# Patient Record
Sex: Male | Born: 1950 | Race: White | Hispanic: No | Marital: Single | Smoking: Never smoker
Health system: Southern US, Community
[De-identification: ages and names within clinical notes are randomized; demographics above are authoritative.]

## PROBLEM LIST (undated history)

## (undated) DIAGNOSIS — E119 Type 2 diabetes mellitus without complications: Secondary | ICD-10-CM

## (undated) DIAGNOSIS — E785 Hyperlipidemia, unspecified: Secondary | ICD-10-CM

## (undated) DIAGNOSIS — I219 Acute myocardial infarction, unspecified: Secondary | ICD-10-CM

## (undated) DIAGNOSIS — I10 Essential (primary) hypertension: Secondary | ICD-10-CM

## (undated) DIAGNOSIS — I639 Cerebral infarction, unspecified: Secondary | ICD-10-CM

## (undated) HISTORY — DX: Essential (primary) hypertension: I10

## (undated) HISTORY — DX: Hyperlipidemia, unspecified: E78.5

## (undated) HISTORY — DX: Acute myocardial infarction, unspecified: I21.9

## (undated) HISTORY — DX: Type 2 diabetes mellitus without complications: E11.9

## (undated) HISTORY — DX: Cerebral infarction, unspecified: I63.9

---

## 2010-01-29 ENCOUNTER — Ambulatory Visit: Payer: Self-pay | Admitting: Vascular Surgery

## 2010-03-10 ENCOUNTER — Ambulatory Visit: Payer: Self-pay | Admitting: Vascular Surgery

## 2010-07-15 ENCOUNTER — Ambulatory Visit: Payer: Self-pay | Admitting: Vascular Surgery

## 2011-03-17 NOTE — Assessment & Plan Note (Signed)
OFFICE VISIT   [name] [name]  DOB:  [birth year]                                       01/29/2010  CHART#:[id number]   CHIEF COMPLAINT:  Bilateral foot pain and numbness.   HISTORY OF PRESENT ILLNESS:  The patient is a 60 year old male referred  by Dr. Suzette Battiest for evaluation for possible peripheral arterial occlusive  disease.  The patient states that he has pain in both of his feet which  primarily is in the evening.  He said it extends from the knee down to  the foot bilaterally.  He has intermittent pain that occurs at night  time when he is sleeping.  He also has numbness, tingling and pins and  needles sensation in both feet as well.  These symptoms have been  present for several months.  The patient has had some relief since  starting gabapentin.  He denies any claudication symptoms.  He denies  any open nonhealing wounds on the feet.  He has had no real significant  improvement in his symptoms over the last few months.   CHRONIC MEDICAL PROBLEMS:  Include diabetes, hypertension, coronary  artery disease and elevated cholesterol.  He has had diabetes since  2004.  All these medical problems are currently fairly well-controlled  and followed mainly by Dr. Alwyn Ren.   PAST SURGICAL HISTORY:  He had a right cataract removed in 2007.   FAMILY HISTORY:  Unremarkable.   SOCIAL HISTORY:  He is single, has two children.  He is currently  unemployed.  He is a nonsmoker, nonconsumer of alcohol.   REVIEW OF SYSTEMS:  Full 12 point review of systems were performed with  the patient today.  Please see intake referral form for details  regarding this.   PHYSICAL EXAM:  Vital signs:  Blood pressure 168/87 in the left arm,  pulse is 73 and regular, temperature is 97.8.  HEENT:  Unremarkable.  Neck:  Has 2+ carotid pulses without bruit.  Chest:  Clear to  auscultation.  Cardiac:  Exam is regular rate and rhythm without murmur.  Abdomen:  Is slightly obese, soft,  nontender, nondistended.  No masses.  Extremities:  He has 2+ femoral, 2+ popliteal pulses bilaterally.  He  has 2+ posterior tibial pulses bilaterally.  He has a 2+ right dorsalis  pedis pulse.  He has a 1+ left dorsalis pedis pulse.  There is some dry  skin on the feet bilaterally.  There is no open laceration.  There is no  significant hair loss.  Neurologic:  Exam shows decreased sensation to  fine touch in the feet bilaterally.  Upper extremity and lower extremity  motor strength is 5/5 and symmetric.  Skin:  Has no open ulcers or  rashes except as mentioned above.   He had bilateral ABIs performed today which were normal bilaterally with  an ABI of 1.07 on the left, 1.11 on the right.  Waveforms were biphasic  bilaterally.   In summary, the patient has a long history of diabetes.  His symptoms  are most consistent with diabetic neuropathy.  Although he may have some  mild peripheral arterial disease he certainly does not require an  intervention at this point as he has palpable pulses in his feet  bilaterally and has bilateral normal ABIs.  I counseled him today that  the best management of his  lower extremity pain symptoms are going to be  primarily treatment of his neuropathy.  This included to continue to  take his gabapentin as well as very strict control of his blood glucose  and overall risk factors.  I also instructed him today in care of his  feet.  He would also benefit from a moisturizing lotion for both legs to  prevent skin cracking and open ulcerations.  He will follow up with me  on an as-needed basis.     Janetta Hora. Fields, MD  Electronically Signed   CEF/MEDQ  D:  01/29/2010  T:  01/30/2010  Job:  [id number]   cc:   Sherlynn Stalls  Dr Celene Kras

## 2011-11-06 ENCOUNTER — Ambulatory Visit (INDEPENDENT_AMBULATORY_CARE_PROVIDER_SITE_OTHER): Payer: Medicare Other | Admitting: Family Medicine

## 2011-11-06 DIAGNOSIS — I10 Essential (primary) hypertension: Secondary | ICD-10-CM

## 2011-11-06 DIAGNOSIS — D509 Iron deficiency anemia, unspecified: Secondary | ICD-10-CM

## 2012-02-03 ENCOUNTER — Other Ambulatory Visit: Payer: Self-pay | Admitting: Family Medicine

## 2012-02-05 ENCOUNTER — Ambulatory Visit: Payer: Medicare Other | Admitting: Family Medicine

## 2012-02-11 ENCOUNTER — Encounter: Payer: Self-pay | Admitting: Family Medicine

## 2012-02-11 ENCOUNTER — Ambulatory Visit (INDEPENDENT_AMBULATORY_CARE_PROVIDER_SITE_OTHER): Payer: Medicare Other | Admitting: Family Medicine

## 2012-02-11 VITALS — BP 170/80 | HR 64 | Temp 97.6°F | Resp 16 | Ht 68.0 in | Wt 238.0 lb

## 2012-02-11 DIAGNOSIS — IMO0001 Reserved for inherently not codable concepts without codable children: Secondary | ICD-10-CM

## 2012-02-11 DIAGNOSIS — E1142 Type 2 diabetes mellitus with diabetic polyneuropathy: Secondary | ICD-10-CM

## 2012-02-11 DIAGNOSIS — I10 Essential (primary) hypertension: Secondary | ICD-10-CM

## 2012-02-11 MED ORDER — INSULIN REGULAR HUMAN 100 UNIT/ML IJ SOLN: INTRAMUSCULAR | Status: DC

## 2012-02-11 MED ORDER — LISINOPRIL-HYDROCHLOROTHIAZIDE 20-12.5 MG PO TABS: 1.0000 | ORAL_TABLET | Freq: Every day | ORAL | Status: DC

## 2012-02-11 MED ORDER — FENOFIBRIC ACID 105 MG PO TABS: 105.0000 mg | ORAL_TABLET | Freq: Every day | ORAL | Status: DC

## 2012-02-11 MED ORDER — TRAMADOL HCL 50 MG PO TABS: ORAL_TABLET | ORAL | Status: DC

## 2012-02-11 MED ORDER — METOPROLOL SUCCINATE ER 25 MG PO TB24: 25.0000 mg | ORAL_TABLET | Freq: Two times a day (BID) | ORAL | Status: DC

## 2012-02-11 MED ORDER — GABAPENTIN 300 MG PO CAPS: 300.0000 mg | ORAL_CAPSULE | Freq: Three times a day (TID) | ORAL | Status: DC

## 2012-02-11 MED ORDER — INSULIN GLARGINE 100 UNIT/ML ~~LOC~~ SOLN: 38.0000 [IU] | Freq: Two times a day (BID) | SUBCUTANEOUS | Status: DC

## 2012-02-15 DIAGNOSIS — I10 Essential (primary) hypertension: Secondary | ICD-10-CM | POA: Insufficient documentation

## 2012-02-15 DIAGNOSIS — IMO0001 Reserved for inherently not codable concepts without codable children: Secondary | ICD-10-CM | POA: Insufficient documentation

## 2012-02-15 NOTE — Progress Notes (Signed)
  Subjective:    Patient ID: [name] [name], male    DOB: [birth year], 61 y.o.   MRN: [id number]  HPI This 61 y.o. Diabetic male is here for medication refills for HTN. Diabetes and neuropathy as well as  Lipid disorder. He is not consistent with FSBS at home; nutrition is improved. (Review of note from last visit in paper chart - Lantus dose= 75 units bid and Humulin R 25 units tid with meals;  but print out of meds lists Lantus at 25 units bid). Pt clarified that he is taking Lantus 75 units once daily at bedtime.    Review of SystemsDenies Hypoglycemic episodes, chest pain, dizziness, diaphoresis, syncope     Objective:   Physical Exam  Vitals reviewed. Constitutional: He is oriented to person, place, and time. He appears well-developed and well-nourished. No distress.  HENT:  Head: Normocephalic and atraumatic.  Eyes: Conjunctivae and EOM are normal. No scleral icterus.  Cardiovascular: Normal rate.   Pulmonary/Chest: Effort normal. No respiratory distress.  Musculoskeletal: He exhibits edema.       Feet: calloused areas on plantar aspect especially involving the great toes and balls of feet  Neurological: He is alert and oriented to person, place, and time. No cranial nerve deficit.  Skin: Skin is dry. There is erythema.       Lower extremity brawny edema with erythema noted  Psychiatric: He has a normal mood and affect. His behavior is normal. Thought content normal.   January 2013: Hb A1c= 9.4%  Today: Hb A1c= 8.3%      Assessment & Plan:   1. Type II or unspecified type diabetes mellitus without mention of complication, uncontrolled  Insulin dose change- Lantus to be given 38 units subq every 12 hours  Pt to do FSBS 2x daily and bring results to next visit. Improve nutrition and work on some weight reduction  2. Obesity, Class II, BMI 35-39.9, with comorbidity    3. HTN (hypertension)  Medications refilled; RTC 3 months

## 2012-02-16 ENCOUNTER — Encounter: Payer: Self-pay | Admitting: Family Medicine

## 2012-02-16 DIAGNOSIS — E1142 Type 2 diabetes mellitus with diabetic polyneuropathy: Secondary | ICD-10-CM | POA: Insufficient documentation

## 2012-02-24 ENCOUNTER — Telehealth: Payer: Self-pay

## 2012-02-24 NOTE — Telephone Encounter (Signed)
Pharmacist called to report that pt called confused about his metoprolol Rx. He used to be on met tartrate one BID, and the new Rx he p/up is for Met succ XL one BID. Spoke w/Dr Audria Nine and she wants pt to take Metoprolol Succ 25 XL, but only one QD and wants pt to RTC in May to see her to check BP since his BP was high at last OV. Called pharm to notify of new sig and they are cancelling RFs since they have the wrong sig on them. Spoke w/pt and explained sig for Rx and f/up. Pt agreed and transferred to appt center to set up appt.

## 2012-03-02 ENCOUNTER — Encounter: Payer: Self-pay | Admitting: Family Medicine

## 2012-03-02 ENCOUNTER — Ambulatory Visit (INDEPENDENT_AMBULATORY_CARE_PROVIDER_SITE_OTHER): Payer: Medicare Other | Admitting: Family Medicine

## 2012-03-02 VITALS — BP 110/69 | HR 69 | Temp 98.3°F | Resp 20 | Ht 67.5 in | Wt 232.8 lb

## 2012-03-02 DIAGNOSIS — I10 Essential (primary) hypertension: Secondary | ICD-10-CM

## 2012-03-02 NOTE — Progress Notes (Signed)
  Subjective:    Patient ID: [name] [name], male    DOB: Oct 08, 1951, 61 y.o.   MRN: [id number]  HPI This 61 y.o.Cauc male returns for BP recheck. He has been compliant with medications and  daily exercise. He denies HA, CP, dizziness, palpitations or edema.    Review of Systems Noncontributory     Objective:   Physical Exam Pt is well- nourished and well-developed; in NAD. Respirations are normal with normal  effort. His heart rate is normal. Neurological exam is grossly normal; pt ambulates with a cane.        Assessment & Plan:  HTN-  Excellent control. No medication changes. RTC in July as scheduled.

## 2012-04-23 ENCOUNTER — Other Ambulatory Visit: Payer: Self-pay

## 2012-04-23 MED ORDER — METOPROLOL SUCCINATE ER 25 MG PO TB24: 25.0000 mg | ORAL_TABLET | Freq: Two times a day (BID) | ORAL | Status: DC

## 2012-05-12 ENCOUNTER — Ambulatory Visit: Payer: Medicare Other | Admitting: Family Medicine

## 2012-05-26 ENCOUNTER — Telehealth: Payer: Self-pay

## 2012-05-26 NOTE — Telephone Encounter (Signed)
I have filled out the form and it is at the TL station - please put NPI on it please.

## 2012-05-26 NOTE — Telephone Encounter (Signed)
Pt needs diabetic supplies call to [facility] - [phone] - please call them and authorize what the patient needs - he should be testing once a day.

## 2012-05-26 NOTE — Telephone Encounter (Signed)
Called customer service [phone]. Can only mail or fax order. Re-faxing paperwork.

## 2012-05-26 NOTE — Telephone Encounter (Signed)
PT STATES [facility] HAS FAXED/CALLED FOR REFILL ON TEST STRIPS AND  WE HAVE NOT RESPONDED,PLEASE F/U  BEST PHONE FOR PT [phone]

## 2012-05-27 NOTE — Telephone Encounter (Signed)
All contact numbers are incorrect. Paperwork has been taken care of and faxed to [facility].

## 2012-06-21 ENCOUNTER — Other Ambulatory Visit: Payer: Self-pay | Admitting: Physician Assistant

## 2012-08-08 ENCOUNTER — Other Ambulatory Visit: Payer: Self-pay | Admitting: *Deleted

## 2012-08-08 MED ORDER — INSULIN GLARGINE 100 UNIT/ML ~~LOC~~ SOLN: 38.0000 [IU] | Freq: Two times a day (BID) | SUBCUTANEOUS | Status: DC

## 2012-08-11 ENCOUNTER — Other Ambulatory Visit: Payer: Self-pay

## 2012-08-11 MED ORDER — TRAMADOL HCL 50 MG PO TABS: ORAL_TABLET | ORAL | Status: DC

## 2012-08-16 ENCOUNTER — Other Ambulatory Visit: Payer: Self-pay | Admitting: Radiology

## 2012-08-23 ENCOUNTER — Other Ambulatory Visit: Payer: Self-pay | Admitting: Physician Assistant

## 2012-08-23 ENCOUNTER — Other Ambulatory Visit: Payer: Self-pay

## 2012-08-23 MED ORDER — LISINOPRIL-HYDROCHLOROTHIAZIDE 20-12.5 MG PO TABS: 1.0000 | ORAL_TABLET | Freq: Every day | ORAL | Status: DC

## 2012-08-23 MED ORDER — INSULIN REGULAR HUMAN 100 UNIT/ML IJ SOLN: INTRAMUSCULAR | Status: DC

## 2012-08-23 MED ORDER — METOPROLOL SUCCINATE ER 25 MG PO TB24: 25.0000 mg | ORAL_TABLET | Freq: Every day | ORAL | Status: DC

## 2012-09-07 ENCOUNTER — Telehealth: Payer: Self-pay

## 2012-09-07 MED ORDER — TRAMADOL HCL 50 MG PO TABS: ORAL_TABLET | ORAL | Status: DC

## 2012-09-07 NOTE — Telephone Encounter (Signed)
Not in RX pool please advise

## 2012-09-07 NOTE — Telephone Encounter (Signed)
Patient advised.

## 2012-09-07 NOTE — Telephone Encounter (Signed)
Refill done and sent in. 

## 2012-09-07 NOTE — Telephone Encounter (Signed)
Pt says that [facility] has faxed [place] several times about his rx for tramadol, [facility] has not heard back from [place] would like to know what is going on ??? Please call patient at [phone]

## 2012-09-16 ENCOUNTER — Other Ambulatory Visit: Payer: Self-pay | Admitting: Physician Assistant

## 2012-09-25 ENCOUNTER — Other Ambulatory Visit: Payer: Self-pay | Admitting: Physician Assistant

## 2012-10-04 ENCOUNTER — Other Ambulatory Visit: Payer: Self-pay | Admitting: Physician Assistant

## 2012-10-06 ENCOUNTER — Telehealth: Payer: Self-pay

## 2012-10-06 ENCOUNTER — Other Ambulatory Visit: Payer: Self-pay | Admitting: Family Medicine

## 2012-10-06 MED ORDER — GLUCOSE BLOOD VI STRP: ORAL_STRIP | Status: DC

## 2012-10-06 NOTE — Telephone Encounter (Signed)
Needs office visit.

## 2012-10-06 NOTE — Telephone Encounter (Signed)
Pt is requesting Diabetic testing supplies (last office visit was April 2013 and A1c= 8.3%). I am familiar with [facility] supplies. I have ordered strips (e-prescribed to pt's pharmacy) along with glucometer.

## 2012-10-06 NOTE — Telephone Encounter (Signed)
Dr. Audria Nine - information of medical supplier is [facility] for diabetic supplies   cbn [phone]

## 2012-10-14 ENCOUNTER — Telehealth: Payer: Self-pay

## 2012-10-14 NOTE — Telephone Encounter (Signed)
I think the call was last week, to advise his supplies ordered for diabetes. He was not in need of this. He states he has this already. He states [name] [name] has left message, to verify his address, it is the same as is listed in the computer. FYI  Brandii Lakey

## 2012-10-14 NOTE — Telephone Encounter (Signed)
The patient stated that he was calling to return a missed call.  No call to patient documented in system.  Please call the patient at [phone].

## 2012-10-14 NOTE — Telephone Encounter (Signed)
Left message to call office to verify address per Dr Audria Nine.  Trying to complete a request for refill of diabetic supplies but address on request does not match address in epic.

## 2012-10-24 ENCOUNTER — Other Ambulatory Visit: Payer: Self-pay | Admitting: Physician Assistant

## 2012-10-24 NOTE — Telephone Encounter (Signed)
Needs OV.  

## 2012-10-27 ENCOUNTER — Other Ambulatory Visit: Payer: Self-pay | Admitting: Physician Assistant

## 2012-11-05 DIAGNOSIS — I10 Essential (primary) hypertension: Secondary | ICD-10-CM

## 2012-11-14 ENCOUNTER — Other Ambulatory Visit: Payer: Self-pay | Admitting: Physician Assistant

## 2012-11-20 ENCOUNTER — Ambulatory Visit (INDEPENDENT_AMBULATORY_CARE_PROVIDER_SITE_OTHER): Payer: Medicare Other | Admitting: Family Medicine

## 2012-11-20 VITALS — BP 192/80 | HR 80 | Temp 98.1°F | Resp 18 | Ht 68.0 in | Wt 228.0 lb

## 2012-11-20 DIAGNOSIS — E1140 Type 2 diabetes mellitus with diabetic neuropathy, unspecified: Secondary | ICD-10-CM

## 2012-11-20 DIAGNOSIS — E1065 Type 1 diabetes mellitus with hyperglycemia: Secondary | ICD-10-CM

## 2012-11-20 DIAGNOSIS — E1149 Type 2 diabetes mellitus with other diabetic neurological complication: Secondary | ICD-10-CM

## 2012-11-20 DIAGNOSIS — L218 Other seborrheic dermatitis: Secondary | ICD-10-CM

## 2012-11-20 DIAGNOSIS — L210 Seborrhea capitis: Secondary | ICD-10-CM

## 2012-11-20 DIAGNOSIS — IMO0002 Reserved for concepts with insufficient information to code with codable children: Secondary | ICD-10-CM

## 2012-11-20 LAB — COMPREHENSIVE METABOLIC PANEL
ALT: 45 U/L (ref 0–53)
AST: 31 U/L (ref 0–37)
Albumin: 4.8 g/dL (ref 3.5–5.2)
Alkaline Phosphatase: 78 U/L (ref 39–117)
BUN: 31 mg/dL — ABNORMAL HIGH (ref 6–23)
CO2: 27 mEq/L (ref 19–32)
Calcium: 9.6 mg/dL (ref 8.4–10.5)
Chloride: 95 mEq/L — ABNORMAL LOW (ref 96–112)
Creat: 1.7 mg/dL — ABNORMAL HIGH (ref 0.50–1.35)
Glucose, Bld: 399 mg/dL — ABNORMAL HIGH (ref 70–99)
Potassium: 4.7 mEq/L (ref 3.5–5.3)
Sodium: 135 mEq/L (ref 135–145)
Total Bilirubin: 0.7 mg/dL (ref 0.3–1.2)
Total Protein: 7.6 g/dL (ref 6.0–8.3)

## 2012-11-20 LAB — LIPID PANEL
Cholesterol: 228 mg/dL — ABNORMAL HIGH (ref 0–200)
HDL: 26 mg/dL — ABNORMAL LOW (ref >39)
Total CHOL/HDL Ratio: 8.8 Ratio
Triglycerides: 774 mg/dL — ABNORMAL HIGH (ref <150)

## 2012-11-20 LAB — GLUCOSE, POCT (MANUAL RESULT ENTRY): POC Glucose: 436 mg/dl — AB (ref 70–99)

## 2012-11-20 LAB — POCT GLYCOSYLATED HEMOGLOBIN (HGB A1C): Hemoglobin A1C: 10

## 2012-11-20 MED ORDER — INSULIN GLARGINE 100 UNIT/ML ~~LOC~~ SOLN: 50.0000 [IU] | Freq: Every day | SUBCUTANEOUS | Status: DC

## 2012-11-20 MED ORDER — INSULIN REGULAR HUMAN 100 UNIT/ML IJ SOLN: 50.0000 [IU] | Freq: Two times a day (BID) | INTRAMUSCULAR | Status: DC

## 2012-11-20 MED ORDER — TRAMADOL HCL 50 MG PO TABS: 50.0000 mg | ORAL_TABLET | Freq: Three times a day (TID) | ORAL | Status: DC | PRN

## 2012-11-20 MED ORDER — METOPROLOL SUCCINATE ER 25 MG PO TB24: 25.0000 mg | ORAL_TABLET | Freq: Every day | ORAL | Status: DC

## 2012-11-20 MED ORDER — TRAMADOL HCL 50 MG PO TABS: ORAL_TABLET | ORAL | Status: DC

## 2012-11-20 MED ORDER — LISINOPRIL-HYDROCHLOROTHIAZIDE 20-12.5 MG PO TABS: 1.0000 | ORAL_TABLET | Freq: Every day | ORAL | Status: DC

## 2012-11-20 MED ORDER — GABAPENTIN 300 MG PO CAPS: 300.0000 mg | ORAL_CAPSULE | Freq: Three times a day (TID) | ORAL | Status: DC

## 2012-11-20 MED ORDER — FENOFIBRIC ACID 105 MG PO TABS: 105.0000 mg | ORAL_TABLET | Freq: Every day | ORAL | Status: DC

## 2012-11-20 MED ORDER — KETOCONAZOLE 2 % EX SHAM: MEDICATED_SHAMPOO | CUTANEOUS | Status: AC

## 2012-11-20 MED ORDER — KETOROLAC TROMETHAMINE 60 MG/2ML IM SOLN
60.0000 mg | Freq: Once | INTRAMUSCULAR | Status: AC
  Administered 2012-11-20: 60 mg via INTRAMUSCULAR

## 2012-11-20 NOTE — Progress Notes (Signed)
  Subjective:    Patient ID: [name] [name], male    DOB: [birth year], 62 y.o.   MRN: [id number]  HPI  Patient presents to clinic states he ran out of insulin 3 days ago, and could not get refills of his medication from the pharmacy. He complains of pain bilateral feet and hands, states it is neuropathy pain and is worsening since he ran out of his insulin . He requests tramadol for pain of hands and feet and requests refills of Lantus and Humulin R. He states he checks his blood sugars occasionally, normally it runs 212. He states he checked it after he ran out and it was above 300, but he does not recall when he checked it. Patient is a very poor historian. He states he is upset because he did not know he was due for follow up and no one advised him of this.   Review of Systems     Objective:   Physical Exam Over weight male, appears stated age. See Dr Tommi Rumps note for complete dictation/ office visit.      Assessment & Plan:  60mg  Toradol given IM R glut.

## 2012-11-20 NOTE — Progress Notes (Signed)
Patient ID: [name] [name] MRN: [id number], DOB: 06-Jun-1951, 62 y.o. Date of Encounter: 11/20/2012, 12:35 PM  Primary Physician: No primary provider on file.  Chief Complaint: Diabetes follow up  HPI: 62 y.o. year old male with history below presents for follow up of diabetes mellitus. Doing well. No issues or complaints. Taking medications daily without adverse effects. No polydipsia, polyphagia, polyuria, or nocturia.  Blood sugars at home: over 300 lately since he ran out of meds Diet consists of:  Controlling portions Exercising regularly:  Walking when weather is good.  Thinking about joining a gym.  Now disabled because of diabetes and neuropathy Last A1C:  8.3 10 months  ago Developed diabetes 9 years ago. 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   Home Meds: Prior to Admission medications   Medication Sig Start Date End Date Taking? Authorizing Provider  aspirin 81 MG tablet Take 81 mg by mouth daily.   Yes Historical Provider, MD  Fenofibric Acid 105 MG TABS Take 1 tablet (105 mg total) by mouth daily. NEEDS OFFICE VISIT/LABS 11/20/12  Yes [name], MD  gabapentin (NEURONTIN) 300 MG capsule Take 1 capsule (300 mg total) by mouth 3 (three) times daily. 11/20/12  Yes [name], MD  lisinopril-hydrochlorothiazide (PRINZIDE,ZESTORETIC) 20-12.5 MG per tablet Take 1 tablet by mouth daily. 11/20/12  Yes [name], MD  metoprolol succinate (TOPROL-XL) 25 MG 24 hr tablet Take 1 tablet (25 mg total) by mouth daily. NEED OFFICE VISIT 11/20/12  Yes [name], MD  Multiple Vitamin (MULTIVITAMIN) tablet Take 1 tablet  by mouth daily.   Yes Historical Provider, MD  traMADol (ULTRAM) 50 MG tablet Take 1 tablet (50 mg total) by mouth every 8 (eight) hours as needed for pain. Need office visit & labs for additional refills. SECOND NOTICE. 11/20/12  Yes [name], MD  glucose blood test strip Use as instructed 10/06/12   Maurice March, MD  insulin glargine (LANTUS) 100 UNIT/ML injection Inject 50 Units into the skin daily. 11/20/12   [name], MD  insulin regular (HUMULIN R) 100 units/mL injection Inject 0.5 mLs (50 Units total) into the skin 2 (two) times daily before a meal. 11/20/12   [name], MD    Allergies: No Known Allergies  History   Social History  . Marital Status: Single    Spouse Name: N/A    Number of Children: N/A  . Years of Education: N/A   Occupational History  . Not on file.   Social History Main Topics  . Smoking status: Never Smoker   . Smokeless tobacco: Not on file  . Alcohol Use: No  . Drug Use: No  . Sexually Active: Not on file   Other Topics Concern  . Not on file   Social History Narrative  . No narrative on file     Review of Systems: Constitutional: negative for chills, fever, night sweats, weight changes, or fatigue  HEENT: negative for vision changes, hearing loss, congestion, rhinorrhea, or epistaxis Cardiovascular: dyspnea on exertion with two flights of stairs.  No chest pain or edema of ankles. Respiratory: negative for hemoptysis, wheezing,  or  cough Abdominal: negative for abdominal pain, nausea, vomiting, diarrhea, or constipation Dermatological: negative for rash, erythema, or wounds Neurologic: negative for headache, dizziness, or syncope.  Feet and hands are numb and painful. Renal:  Negative for polyuria, polydipsia, or dysuria All other systems reviewed and are otherwise negative with the exception to those above and in the HPI.   Physical Exam: Blood pressure 192/80, pulse 80, temperature 98.1 F (36.7 C), resp. rate 18,  height 5\' 8"  (1.727 m), weight 228 lb (103.42 kg), SpO2 98.00%., Body mass index is 34.67 kg/(m^2). General: Well developed, well nourished, in no acute distress. Head: Normocephalic, atraumatic, eyes without discharge, sclera non-icteric, nares are without discharge. Bilateral auditory canals clear, TM's are without perforation, pearly grey and translucent with reflective cone of light bilaterally. Oral cavity moist, posterior pharynx without exudate, erythema, peritonsillar abscess, or post nasal drip. Fundi: no exudates or hemorrhages EOM:  Exophoria left eye  Neck: Supple. No thyromegaly. Full ROM. No lymphadenopathy. Lungs: Clear bilaterally to auscultation without wheezes, rales, or rhonchi. Breathing is unlabored. Heart: RRR with S1 S2. No murmurs, rubs, or gallops appreciated. Abdomen: Soft, non-tender, non-distended with normoactive bowel sounds. No hepatosplenomegaly. No rebound/guarding. No obvious abdominal masses. Msk:  Strength and tone normal for age. Extremities/Skin: Warm and dry. No clubbing or cyanosis. No edema. Papular erythema both shins, no wounds, or suspicious lesions.  Peeling facial rash c/w seborrhea, calluses on feet. Monofilament exam abnormal both feet, left little finger Neuro: Alert and oriented X 3. Moves all extremities spontaneously. Gait is normal. CNII-XII grossly in tact. Psych:  Responds to questions appropriately with a normal affect.   Labs:   ASSESSMENT AND PLAN:  62 y.o. year old male with diabetes, diabetic neuropathy, and rash face and legs. Recommend dental exam, poor dentition.  Meds ordered this encounter  Medications  . traMADol (ULTRAM) 50 MG tablet    Sig: Take 1 tablet (50 mg total) by mouth every 8 (eight) hours as needed for pain. Need office visit & labs for additional refills. SECOND NOTICE.    Dispense:  30 tablet    Refill:  0  . DISCONTD: insulin glargine (LANTUS) 100 UNIT/ML injection    Sig: Inject 50 Units into the skin daily.      Dispense:  10 mL    Refill:  11    NEEDS OFFICE VISIT BEFORE RUNS OUT!!!  . DISCONTD: insulin regular (HUMULIN R) 100 units/mL injection    Sig: Inject 0.5 mLs (50 Units total) into the skin 2 (two) times daily before a meal.    Dispense:  10 mL    Refill:  11    NEEDS OFFICE VISIT BEFORE RUNS OUT!!!  . lisinopril-hydrochlorothiazide (PRINZIDE,ZESTORETIC) 20-12.5 MG per tablet    Sig: Take 1 tablet by mouth daily.    Dispense:  90 tablet    Refill:  3  . metoprolol succinate (TOPROL-XL) 25 MG 24 hr tablet    Sig: Take 1 tablet (25 mg total) by mouth daily. NEED OFFICE VISIT    Dispense:  30 tablet    Refill:  0  . gabapentin (NEURONTIN) 300 MG capsule    Sig: Take 1 capsule (300 mg total) by mouth 3 (three) times daily.    Dispense:  270 capsule    Refill:  3  . Fenofibric Acid 105 MG TABS    Sig: Take 1 tablet (105 mg total) by mouth daily. NEEDS OFFICE VISIT/LABS    Dispense:  90 tablet    Refill:  3  . insulin glargine (LANTUS) 100 UNIT/ML injection    Sig: Inject 50 Units into the skin daily.    Dispense:  10 mL    Refill:  11  . insulin regular (HUMULIN R) 100 units/mL injection    Sig: Inject 0.5 mLs (50 Units total) into the skin 2 (two) times daily before a meal.    Dispense:  10 mL    Refill:  11  . ketoconazole (NIZORAL) 2 % shampoo    Sig: Apply topically 2 (two) times a week.    Dispense:  120 mL    Refill:  6   [name], MD 11/20/2012 12:35 PM

## 2012-11-21 ENCOUNTER — Telehealth: Payer: Self-pay

## 2012-11-21 LAB — MICROALBUMIN, URINE: Microalb, Ur: 69.01 mg/dL — ABNORMAL HIGH (ref 0.00–1.89)

## 2012-11-21 NOTE — Telephone Encounter (Signed)
Patients states he will come in tomorrow morning to see Dr. Milus Glazier.

## 2012-11-22 ENCOUNTER — Ambulatory Visit (INDEPENDENT_AMBULATORY_CARE_PROVIDER_SITE_OTHER): Payer: Medicare Other | Admitting: Family Medicine

## 2012-11-22 VITALS — BP 156/80 | HR 72 | Temp 98.3°F | Resp 18 | Ht 68.0 in | Wt 234.0 lb

## 2012-11-22 DIAGNOSIS — R809 Proteinuria, unspecified: Secondary | ICD-10-CM

## 2012-11-22 DIAGNOSIS — E785 Hyperlipidemia, unspecified: Secondary | ICD-10-CM

## 2012-11-22 DIAGNOSIS — R7989 Other specified abnormal findings of blood chemistry: Secondary | ICD-10-CM

## 2012-11-22 DIAGNOSIS — IMO0001 Reserved for inherently not codable concepts without codable children: Secondary | ICD-10-CM

## 2012-11-22 DIAGNOSIS — E1065 Type 1 diabetes mellitus with hyperglycemia: Secondary | ICD-10-CM

## 2012-11-22 LAB — GLUCOSE, POCT (MANUAL RESULT ENTRY): POC Glucose: 234 mg/dl — AB (ref 70–99)

## 2012-11-22 MED ORDER — ROSUVASTATIN CALCIUM 10 MG PO TABS: 10.0000 mg | ORAL_TABLET | Freq: Every day | ORAL | Status: DC

## 2012-11-22 NOTE — Progress Notes (Signed)
62 yo IDDM patient with recent very elevated glucose, A1C of 10, microalb of 69, creat of 1.7  Today's FBS at home was 200.  O:  Alert, NAD, good eye contact Skin:  Less plethoric that 2 days ago Results for orders placed in visit on 11/22/12  GLUCOSE, POCT (MANUAL RESULT ENTRY)      Component Value Range   POC Glucose 234 (*) 70 - 99 mg/dl     Assessment:  Significant improvement since meds restarted. I'm concerned about the abnormal renal functions although the change is mild and may return to normal once ligation is achieved.  Plan:  Add Crestor 10 mg qd to regimen Recheck in mid March with followup A1c and renal function tests.  I spent 15 minutes face-to-face with patient reviewing lab tests.

## 2012-11-22 NOTE — Progress Notes (Signed)
Appt made with Dr. Elbert Ewings ( per pt request) for 01/12/13. Lennon Alstrom

## 2012-12-18 ENCOUNTER — Other Ambulatory Visit: Payer: Self-pay | Admitting: Family Medicine

## 2012-12-30 ENCOUNTER — Other Ambulatory Visit: Payer: Self-pay | Admitting: *Deleted

## 2012-12-30 MED ORDER — GLUCOSE BLOOD VI STRP: ORAL_STRIP | Status: DC

## 2013-01-03 ENCOUNTER — Telehealth: Payer: Self-pay

## 2013-01-03 ENCOUNTER — Other Ambulatory Visit: Payer: Self-pay | Admitting: Family Medicine

## 2013-01-03 MED ORDER — GLUCOSE BLOOD VI STRP: ORAL_STRIP | Status: DC

## 2013-01-03 NOTE — Telephone Encounter (Signed)
Patient would like refill on [employer] test strips.  Please send to [facility], [place], [place].

## 2013-01-12 ENCOUNTER — Ambulatory Visit: Payer: Medicare Other | Admitting: Family Medicine

## 2013-01-19 ENCOUNTER — Other Ambulatory Visit: Payer: Self-pay | Admitting: Physician Assistant

## 2013-02-02 ENCOUNTER — Ambulatory Visit (INDEPENDENT_AMBULATORY_CARE_PROVIDER_SITE_OTHER): Payer: Medicare Other | Admitting: Family Medicine

## 2013-02-02 ENCOUNTER — Encounter: Payer: Self-pay | Admitting: Family Medicine

## 2013-02-02 VITALS — BP 126/71 | HR 67 | Temp 98.0°F | Resp 16 | Ht 67.5 in | Wt 234.0 lb

## 2013-02-02 DIAGNOSIS — Z23 Encounter for immunization: Secondary | ICD-10-CM

## 2013-02-02 DIAGNOSIS — E109 Type 1 diabetes mellitus without complications: Secondary | ICD-10-CM

## 2013-02-02 LAB — POCT GLYCOSYLATED HEMOGLOBIN (HGB A1C): Hemoglobin A1C: 7.8

## 2013-02-02 LAB — LIPID PANEL
Cholesterol: 150 mg/dL (ref 0–200)
HDL: 24 mg/dL — ABNORMAL LOW (ref >39)
LDL Cholesterol: 56 mg/dL (ref 0–99)
Total CHOL/HDL Ratio: 6.3 Ratio
Triglycerides: 348 mg/dL — ABNORMAL HIGH (ref <150)
VLDL: 70 mg/dL — ABNORMAL HIGH (ref 0–40)

## 2013-02-02 LAB — COMPREHENSIVE METABOLIC PANEL
ALT: 43 U/L (ref 0–53)
AST: 43 U/L — ABNORMAL HIGH (ref 0–37)
Albumin: 4.7 g/dL (ref 3.5–5.2)
Alkaline Phosphatase: 64 U/L (ref 39–117)
BUN: 34 mg/dL — ABNORMAL HIGH (ref 6–23)
CO2: 27 mEq/L (ref 19–32)
Calcium: 10 mg/dL (ref 8.4–10.5)
Chloride: 101 mEq/L (ref 96–112)
Creat: 1.75 mg/dL — ABNORMAL HIGH (ref 0.50–1.35)
Glucose, Bld: 101 mg/dL — ABNORMAL HIGH (ref 70–99)
Potassium: 4.4 mEq/L (ref 3.5–5.3)
Sodium: 139 mEq/L (ref 135–145)
Total Bilirubin: 0.5 mg/dL (ref 0.3–1.2)
Total Protein: 7.3 g/dL (ref 6.0–8.3)

## 2013-02-02 LAB — GLUCOSE, POCT (MANUAL RESULT ENTRY): POC Glucose: 92 mg/dl (ref 70–99)

## 2013-02-02 MED ORDER — PNEUMOCOCCAL VAC POLYVALENT 25 MCG/0.5ML IJ INJ: 0.5000 mL | INJECTION | INTRAMUSCULAR | Status: AC

## 2013-02-02 NOTE — Progress Notes (Signed)
Patient ID: [name] [name] MRN: [id number], DOB: 12/17/1950, 62 y.o. Date of Encounter: 02/02/2013, 12:01 PM  Primary Physician: No primary provider on file.  Chief Complaint: Diabetes follow up  HPI: 62 y.o. year old male with history below presents for follow up of diabetes mellitus. Doing well. No issues or complaints. Taking medications daily without adverse effects. No polydipsia, polyphagia, polyuria, or nocturia.  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 Pneumococcal vaccine:  never  Past Medical History  Diagnosis Date  . Diabetes mellitus without complication   . Hypertension   . Hyperlipidemia   . Myocardial infarction   . Stroke      Home Meds: Prior to Admission medications   Medication Sig Start Date End Date Taking? Authorizing Provider  aspirin 81 MG tablet Take 81 mg by mouth daily.   Yes Historical Provider, MD  Fenofibric Acid 105 MG TABS Take 1 tablet (105 mg total) by mouth daily. NEEDS OFFICE VISIT/LABS 11/20/12  Yes [name], MD  gabapentin (NEURONTIN) 300 MG capsule Take 1 capsule (300 mg total) by mouth 3 (three) times daily. 11/20/12  Yes [name], MD  glucose blood (TRUETRACK TEST) test strip Use as instructed. Dx code 250.00 01/03/13  Yes Maurice March, MD  insulin glargine (LANTUS) 100 UNIT/ML injection Inject 50 Units into the skin daily. 11/20/12  Yes [name], MD  insulin regular (HUMULIN R) 100 units/mL injection Inject 0.5 mLs (50 Units total) into the skin 2 (two) times daily before a meal. 11/20/12  Yes [name], MD  ketoconazole (NIZORAL) 2 % shampoo Apply topically 2 (two) times a week. 11/20/12  Yes [name], MD  lisinopril-hydrochlorothiazide (PRINZIDE,ZESTORETIC) 20-12.5 MG per tablet Take 1 tablet by mouth daily. 11/20/12  Yes [name],  MD  metoprolol succinate (TOPROL-XL) 25 MG 24 hr tablet TAKE 1 TABLET BY MOUTH DAILY 01/19/13  Yes Godfrey Pick, PA-C  Multiple Vitamin (MULTIVITAMIN) tablet Take 1 tablet by mouth daily.   Yes Historical Provider, MD  rosuvastatin (CRESTOR) 10 MG tablet Take 1 tablet (10 mg total) by mouth daily. 11/22/12  Yes [name], MD  traMADol Janean Sark) 50 MG tablet One at bedtime, may repeat x 1 in 6 hours 11/20/12  Yes [name], MD    Allergies: No Known Allergies  History   Social History  . Marital Status: Single    Spouse Name: N/A    Number of Children: N/A  . Years of Education: N/A   Occupational History  . Not on file.   Social History Main Topics  . Smoking status: Never Smoker   . Smokeless tobacco: Not on file  . Alcohol Use: No  . Drug Use: No  . Sexually Active: Not on file   Other Topics Concern  . Not on file   Social History Narrative  . No narrative on file     Review of Systems: Constitutional: negative for chills, fever, night sweats, weight changes, or fatigue  HEENT: negative for vision changes, hearing loss, congestion, rhinorrhea, or epistaxis Cardiovascular: negative for chest pain, palpitations, diaphoresis, DOE, orthopnea, or edema Respiratory: negative for hemoptysis, wheezing, shortness of breath, dyspnea, or cough Abdominal: negative for abdominal pain, nausea, vomiting, diarrhea, or constipation Dermatological: negative for rash, erythema, or wounds Neurologic: negative for headache, dizziness, or  syncope Renal:  Negative for polyuria, polydipsia, or dysuria All other systems reviewed and are otherwise negative with the exception to those above and in the HPI.   Physical Exam: Blood pressure 126/71, pulse 67, temperature 98 F (36.7 C), temperature source Oral, resp. rate 16, height 5' 7.5" (1.715 m), weight 234 lb (106.142 kg)., Body mass index is 36.09 kg/(m^2). General: Well developed, well nourished, in no acute distress. Head:  Normocephalic, atraumatic, eyes without discharge, sclera non-icteric, nares are without discharge. Bilateral auditory canals clear, TM's are without perforation, pearly grey and translucent with reflective cone of light bilaterally. Oral cavity moist, posterior pharynx without exudate, erythema, peritonsillar abscess, or post nasal drip.  Neck: Supple. No thyromegaly. Full ROM. No lymphadenopathy. Lungs: Clear bilaterally to auscultation without wheezes, rales, or rhonchi. Breathing is unlabored. Heart: RRR with S1 S2. No murmurs, rubs, or gallops appreciated. Abdomen: Soft, non-tender, non-distended with normoactive bowel sounds. No hepatosplenomegaly. No rebound/guarding. No obvious abdominal masses. Msk:  Strength and tone normal for age. Extremities/Skin: Warm and dry. No clubbing or cyanosis. No edema. No rashes, wounds, or suspicious lesions. Monofilament exam positive for numbness both feet..  Neuro: Alert and oriented X 3. Moves all extremities spontaneously. Gait is normal. CNII-XII grossly in tact. Psych:  Responds to questions appropriately with a normal affect.   Results for orders placed in visit on 02/02/13  POCT GLYCOSYLATED HEMOGLOBIN (HGB A1C)      Result Value Range   Hemoglobin A1C 7.8    GLUCOSE, POCT (MANUAL RESULT ENTRY)      Result Value Range   POC Glucose 92  70 - 99 mg/dl      ASSESSMENT AND PLAN:  62 y.o. year old male with type 1 diabetes. Diabetes type 1, controlled - Plan: Comprehensive metabolic panel, POCT glycosylated hemoglobin (Hb A1C), Lipid panel, POCT glucose (manual entry), pneumococcal 23 valent vaccine (PNU-IMMUNE) injection 0.5 mL  Need for prophylactic vaccination against Streptococcus pneumoniae (pneumococcus)  -follow up 3 months  Signed, [name], MD 02/02/2013 12:01 PM

## 2013-02-21 ENCOUNTER — Other Ambulatory Visit: Payer: Self-pay | Admitting: Physician Assistant

## 2013-04-13 ENCOUNTER — Ambulatory Visit: Payer: Self-pay | Admitting: Cardiology

## 2013-05-11 ENCOUNTER — Encounter: Payer: Self-pay | Admitting: Family Medicine

## 2013-05-11 ENCOUNTER — Ambulatory Visit (INDEPENDENT_AMBULATORY_CARE_PROVIDER_SITE_OTHER): Payer: Medicare Other | Admitting: Family Medicine

## 2013-05-11 VITALS — BP 122/60 | HR 67 | Temp 98.5°F | Resp 16 | Ht 67.0 in | Wt 227.0 lb

## 2013-05-11 DIAGNOSIS — E1169 Type 2 diabetes mellitus with other specified complication: Secondary | ICD-10-CM

## 2013-05-11 DIAGNOSIS — E11621 Type 2 diabetes mellitus with foot ulcer: Secondary | ICD-10-CM

## 2013-05-11 DIAGNOSIS — L97509 Non-pressure chronic ulcer of other part of unspecified foot with unspecified severity: Secondary | ICD-10-CM

## 2013-05-11 DIAGNOSIS — Z23 Encounter for immunization: Secondary | ICD-10-CM

## 2013-05-11 DIAGNOSIS — E1140 Type 2 diabetes mellitus with diabetic neuropathy, unspecified: Secondary | ICD-10-CM

## 2013-05-11 DIAGNOSIS — E1149 Type 2 diabetes mellitus with other diabetic neurological complication: Secondary | ICD-10-CM

## 2013-05-11 LAB — POCT GLYCOSYLATED HEMOGLOBIN (HGB A1C): Hemoglobin A1C: 8.8

## 2013-05-11 MED ORDER — TETANUS-DIPHTH-ACELL PERTUSSIS 5-2.5-18.5 LF-MCG/0.5 IM SUSP: 0.5000 mL | Freq: Once | INTRAMUSCULAR | Status: DC

## 2013-05-11 MED ORDER — SILVER SULFADIAZINE 1 % EX CREA: TOPICAL_CREAM | Freq: Every day | CUTANEOUS | Status: DC

## 2013-05-11 MED ORDER — TRAMADOL HCL 50 MG PO TABS: ORAL_TABLET | ORAL | Status: DC

## 2013-05-11 NOTE — Patient Instructions (Addendum)
Return in two weeks if foot ulcer is not mostly healed.  Return in October for next diabetes check.

## 2013-05-11 NOTE — Progress Notes (Signed)
Patient ID: [name] [name] MRN: [id number], DOB: [birth year], 62 y.o. Date of Encounter: 05/11/2013, 10:05 AM  Primary Physician: No primary provider on file.  Chief Complaint: Diabetes follow up  HPI: 62 y.o. year old male with history below presents for follow up of diabetes mellitus. Doing well. No issues or complaints. Taking medications daily without adverse effects. No polydipsia, polyphagia, polyuria, or nocturia.  Blood sugars at home:  91 this am fasting Diet consists of: plenty of veggies Exercising regularly. Walks around the building when weather permits Last A1C:  January it was 10  Patient has a new right foot ulcer for which he is taking neopsorin   This has been going on a couple weeks.  He has no feeling in the foot, so he is able to tolerate the ulcer.  He is not wearing socks today.  He notes not change in vision  Eye MD:  [facility] couple months ago, no problems    Past Medical History  Diagnosis Date  . Diabetes mellitus without complication   . Hypertension   . Hyperlipidemia   . Myocardial infarction   . Stroke      Home Meds: Prior to Admission medications   Medication Sig Start Date End Date Taking? Authorizing Provider  aspirin 81 MG tablet Take 81 mg by mouth daily.   Yes Historical Provider, MD  Fenofibric Acid 105 MG TABS Take 1 tablet (105 mg total) by mouth daily. NEEDS OFFICE VISIT/LABS 11/20/12  Yes [name], MD  gabapentin (NEURONTIN) 300 MG capsule Take 1 capsule (300 mg total) by mouth 3 (three) times daily. 11/20/12  Yes [name], MD  glucose blood (TRUETRACK TEST) test strip Use as instructed. Dx code 250.00 01/03/13  Yes Maurice March, MD  insulin glargine (LANTUS) 100 UNIT/ML injection Inject 50 Units into the skin daily. 11/20/12  Yes [name], MD  insulin regular (HUMULIN R) 100 units/mL injection Inject 0.5 mLs (50 Units total) into the skin 2 (two) times daily before a meal. 11/20/12  Yes [name], MD   ketoconazole (NIZORAL) 2 % shampoo Apply topically 2 (two) times a week. 11/20/12  Yes [name], MD  lisinopril-hydrochlorothiazide (PRINZIDE,ZESTORETIC) 20-12.5 MG per tablet Take 1 tablet by mouth daily. 11/20/12  Yes [name], MD  metoprolol succinate (TOPROL-XL) 25 MG 24 hr tablet TAKE 1 TABLET BY MOUTH DAILY 02/21/13  Yes [name], MD  Multiple Vitamin (MULTIVITAMIN) tablet Take 1 tablet by mouth daily.   Yes Historical Provider, MD  rosuvastatin (CRESTOR) 10 MG tablet Take 1 tablet (10 mg total) by mouth daily. 11/22/12  Yes [name], MD  traMADol Janean Sark) 50 MG tablet One at bedtime, may repeat x 1 in 6 hours 11/20/12  Yes [name], MD    Allergies: No Known Allergies  History   Social History  . Marital Status: Single    Spouse Name: N/A    Number of Children: N/A  . Years of Education: N/A   Occupational History  . Not on file.   Social History Main Topics  . Smoking status: Never Smoker   . Smokeless tobacco: Not on file  . Alcohol Use: No  . Drug Use: No  . Sexually Active: Not on file   Other Topics Concern  . Not on file   Social History Narrative  . No narrative on file     Review of Systems: Constitutional: negative for chills, fever, night sweats, weight changes, or fatigue  HEENT: negative for vision  changes, hearing loss, congestion, rhinorrhea, or epistaxis Cardiovascular: negative for chest pain, palpitations, diaphoresis, DOE, orthopnea, or edema Respiratory: negative for hemoptysis, wheezing, shortness of breath, dyspnea, or cough Abdominal: negative for abdominal pain, nausea, vomiting, diarrhea, or constipation Dermatological: negative for rash, erythema, or wounds Neurologic: negative for headache, dizziness, or syncope Renal:  Negative for polyuria, polydipsia, or dysuria All other systems reviewed and are otherwise negative with the exception to those above and in the HPI.   Physical Exam: Blood pressure  122/60, pulse 67, temperature 98.5 F (36.9 C), temperature source Oral, resp. rate 16, height 5\' 7"  (1.702 m), weight 227 lb (102.967 kg), SpO2 97.00%., Body mass index is 35.55 kg/(m^2). General: Well developed, well nourished, in no acute distress. 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 Oral cavity moist, posterior pharynx without exudate, erythema, peritonsillar abscess, or post nasal drip.  Neck: Supple. No thyromegaly. Full ROM. No lymphadenopathy. Lungs: Clear bilaterally to auscultation without wheezes, rales, or rhonchi. Breathing is unlabored. Heart: RRR with S1 S2. No murmurs, rubs, or gallops appreciated. Abdomen: Soft, non-tender, non-distended with normoactive bowel sounds. No hepatosplenomegaly. No rebound/guarding. No obvious abdominal masses. Msk:  Strength and tone normal for age. Extremities/Skin: Warm and dry. No clubbing or cyanosis. No edema. . Monofilament exam abnormal with loss of feeling diffusely.  Neuro: Alert and oriented X 3. Moves all extremities spontaneously. Gait is normal. CNII-XII grossly in tact.  Patient has 2 cm ulcer plantar base of great toe, full thickness without erythema Psych:  Responds to questions appropriately with a normal affect.    ASSESSMENT AND PLAN:  62 y.o. year old male with diabetes, type 1.  Has foot ulcer and neuropathy.  Blind OS.  I reinforced the need to wear socks.  I'm concerned that patient may not follow through with good wound care even after stressing the importance of it. Diabetic neuropathy - Plan: traMADol (ULTRAM) 50 MG tablet, Comprehensive metabolic panel, POCT glycosylated hemoglobin (Hb A1C)  Diabetic foot ulcer - Plan: TDaP (BOOSTRIX) injection 0.5 mL, silver sulfADIAZINE (SILVADENE)  1 % cream   Return in 3 months for routine check, 2 weeks if foot not healed.  Signed, [name], MD 05/11/2013 10:05 AM

## 2013-05-12 LAB — LIPID PANEL
Cholesterol: 132 mg/dL (ref 0–200)
HDL: 24 mg/dL — ABNORMAL LOW (ref >39)
LDL Cholesterol: 53 mg/dL (ref 0–99)
Total CHOL/HDL Ratio: 5.5 Ratio
Triglycerides: 275 mg/dL — ABNORMAL HIGH (ref <150)
VLDL: 55 mg/dL — ABNORMAL HIGH (ref 0–40)

## 2013-05-12 LAB — COMPREHENSIVE METABOLIC PANEL
ALT: 39 U/L (ref 0–53)
AST: 42 U/L — ABNORMAL HIGH (ref 0–37)
Albumin: 4.3 g/dL (ref 3.5–5.2)
Alkaline Phosphatase: 66 U/L (ref 39–117)
BUN: 24 mg/dL — ABNORMAL HIGH (ref 6–23)
CO2: 30 mEq/L (ref 19–32)
Calcium: 9.5 mg/dL (ref 8.4–10.5)
Chloride: 101 mEq/L (ref 96–112)
Creat: 1.46 mg/dL — ABNORMAL HIGH (ref 0.50–1.35)
Glucose, Bld: 196 mg/dL — ABNORMAL HIGH (ref 70–99)
Potassium: 4.4 mEq/L (ref 3.5–5.3)
Sodium: 138 mEq/L (ref 135–145)
Total Bilirubin: 0.5 mg/dL (ref 0.3–1.2)
Total Protein: 7.2 g/dL (ref 6.0–8.3)

## 2013-08-17 ENCOUNTER — Ambulatory Visit (INDEPENDENT_AMBULATORY_CARE_PROVIDER_SITE_OTHER): Payer: Medicare Other | Admitting: Family Medicine

## 2013-08-17 DIAGNOSIS — R269 Unspecified abnormalities of gait and mobility: Secondary | ICD-10-CM

## 2013-08-17 DIAGNOSIS — L97519 Non-pressure chronic ulcer of other part of right foot with unspecified severity: Secondary | ICD-10-CM

## 2013-08-17 DIAGNOSIS — E11621 Type 2 diabetes mellitus with foot ulcer: Secondary | ICD-10-CM

## 2013-08-17 DIAGNOSIS — E1169 Type 2 diabetes mellitus with other specified complication: Secondary | ICD-10-CM

## 2013-08-17 DIAGNOSIS — R2681 Unsteadiness on feet: Secondary | ICD-10-CM

## 2013-08-17 DIAGNOSIS — L089 Local infection of the skin and subcutaneous tissue, unspecified: Secondary | ICD-10-CM

## 2013-08-17 DIAGNOSIS — E1142 Type 2 diabetes mellitus with diabetic polyneuropathy: Secondary | ICD-10-CM

## 2013-08-17 DIAGNOSIS — L97509 Non-pressure chronic ulcer of other part of unspecified foot with unspecified severity: Secondary | ICD-10-CM

## 2013-08-17 DIAGNOSIS — G729 Myopathy, unspecified: Secondary | ICD-10-CM

## 2013-08-17 DIAGNOSIS — E1140 Type 2 diabetes mellitus with diabetic neuropathy, unspecified: Secondary | ICD-10-CM

## 2013-08-17 DIAGNOSIS — E119 Type 2 diabetes mellitus without complications: Secondary | ICD-10-CM

## 2013-08-17 LAB — CBC WITH DIFFERENTIAL/PLATELET
Basophils Absolute: 0.1 10*3/uL (ref 0.0–0.1)
Basophils Relative: 1 % (ref 0–1)
Eosinophils Absolute: 1 10*3/uL — ABNORMAL HIGH (ref 0.0–0.7)
Eosinophils Relative: 8 % — ABNORMAL HIGH (ref 0–5)
HCT: 36.4 % — ABNORMAL LOW (ref 39.0–52.0)
Hemoglobin: 12.2 g/dL — ABNORMAL LOW (ref 13.0–17.0)
Lymphocytes Relative: 13 % (ref 12–46)
Lymphs Abs: 1.5 10*3/uL (ref 0.7–4.0)
MCH: 25.4 pg — ABNORMAL LOW (ref 26.0–34.0)
MCHC: 33.5 g/dL (ref 30.0–36.0)
MCV: 75.7 fL — ABNORMAL LOW (ref 78.0–100.0)
Monocytes Absolute: 0.6 10*3/uL (ref 0.1–1.0)
Monocytes Relative: 5 % (ref 3–12)
Neutro Abs: 8.3 10*3/uL — ABNORMAL HIGH (ref 1.7–7.7)
Neutrophils Relative %: 73 % (ref 43–77)
Platelets: 293 10*3/uL (ref 150–400)
RBC: 4.81 MIL/uL (ref 4.22–5.81)
RDW: 13.7 % (ref 11.5–15.5)
WBC: 11.5 10*3/uL — ABNORMAL HIGH (ref 4.0–10.5)

## 2013-08-17 LAB — GLUCOSE, POCT (MANUAL RESULT ENTRY): POC Glucose: 189 mg/dl — AB (ref 70–99)

## 2013-08-17 LAB — COMPREHENSIVE METABOLIC PANEL
ALT: 20 U/L (ref 0–53)
AST: 22 U/L (ref 0–37)
Albumin: 4 g/dL (ref 3.5–5.2)
Alkaline Phosphatase: 69 U/L (ref 39–117)
BUN: 27 mg/dL — ABNORMAL HIGH (ref 6–23)
CO2: 29 mEq/L (ref 19–32)
Calcium: 9.4 mg/dL (ref 8.4–10.5)
Chloride: 98 mEq/L (ref 96–112)
Creat: 1.6 mg/dL — ABNORMAL HIGH (ref 0.50–1.35)
Glucose, Bld: 172 mg/dL — ABNORMAL HIGH (ref 70–99)
Potassium: 4.7 mEq/L (ref 3.5–5.3)
Sodium: 136 mEq/L (ref 135–145)
Total Bilirubin: 0.6 mg/dL (ref 0.3–1.2)
Total Protein: 7.4 g/dL (ref 6.0–8.3)

## 2013-08-17 LAB — POCT GLYCOSYLATED HEMOGLOBIN (HGB A1C): Hemoglobin A1C: 8

## 2013-08-17 MED ORDER — INSULIN GLARGINE 100 UNIT/ML ~~LOC~~ SOLN: 40.0000 [IU] | Freq: Every day | SUBCUTANEOUS | Status: DC

## 2013-08-17 MED ORDER — TRAMADOL HCL 50 MG PO TABS: ORAL_TABLET | ORAL | Status: DC

## 2013-08-17 MED ORDER — SILVER SULFADIAZINE 1 % EX CREA: TOPICAL_CREAM | Freq: Every day | CUTANEOUS | Status: DC

## 2013-08-17 MED ORDER — DOXYCYCLINE HYCLATE 100 MG PO TABS: 100.0000 mg | ORAL_TABLET | Freq: Two times a day (BID) | ORAL | Status: DC

## 2013-08-17 NOTE — Patient Instructions (Addendum)
Please return over the weekend.  Blood sugar 189 Hemoglobin A1C 8.0

## 2013-08-17 NOTE — Progress Notes (Signed)
62 yo diabetic man with 10 years of DM and 4 years of neuropathy after returning from [place].  Former [employer], broadcasting/film/video.  Lives in [place] now.  He lost his balance and struck left frontal head on Sunday afternoon, with resulting large swelling and purulent drainage for which he is using topical antibiotics.  No LOC, but area is very tender  His right foot sore is healing slowly with silvadene.  Both feet are completely numb.  Not wearing socks.  Still walking around the block.  Last eye check early summer  Food at grocery store:  [employer] Patient lives alone, drives Both children are grown, live in [place]  Objective:  NAD Large 4 cm swelling left frontal hairline with obvious purulent drainage under black eschar, purulent yellow pus expressed. Blind left eye Oroph:  Very poor dental condition with caries, ginigivitis and receding gums Neck: supple, no adenopathy, no bruits Chest:  Clear,  Heart:  Reg, no murmur Abdomen:  Moderate obesity, nontender Extremities:  Left triceps wasting, thenar wasting bilaterally, left triceps wasting  Right ball of foot has two fissures that are healing without surrounding erythema Skin:  Scattered dorsal hand psoriatic lesions.  Assessment:  Diabetes complicated by neuropathy, unstable gait, diabetic foot ulcer, infected scalp and muscle wasting.  Psoriatic lesions.  Plan:  Refer to neurology followup on Sunday Diabetes - Plan: [facility] Diabetes Foot Exam, POCT glucose (manual entry), POCT glycosylated hemoglobin (Hb A1C), CBC with Differential, Comprehensive metabolic panel, insulin glargine (LANTUS) 100 UNIT/ML injection, Ambulatory referral to Neurology  Superficial injury of scalp with infection, initial encounter - Plan: doxycycline (VIBRA-TABS) 100 MG tablet, traMADol (ULTRAM) 50 MG tablet, Wound culture  Diabetic neuropathy - Plan: traMADol (ULTRAM) 50 MG tablet, Ambulatory referral to Neurology  Diabetic foot ulcer - Plan: silver sulfADIAZINE  (SILVADENE) 1 % cream  Foot ulcer, right - Plan: traMADol (ULTRAM) 50 MG tablet, silver sulfADIAZINE (SILVADENE) 1 % cream  Myopathy - Plan: Ambulatory referral to Neurology  Unstable gait - Plan: Ambulatory referral to Neurology  Signed, [name], MD

## 2013-08-20 ENCOUNTER — Ambulatory Visit (INDEPENDENT_AMBULATORY_CARE_PROVIDER_SITE_OTHER): Payer: Medicare Other | Admitting: Family Medicine

## 2013-08-20 VITALS — BP 122/70 | HR 71 | Temp 98.0°F | Resp 18 | Wt 220.0 lb

## 2013-08-20 DIAGNOSIS — L0291 Cutaneous abscess, unspecified: Secondary | ICD-10-CM

## 2013-08-20 DIAGNOSIS — R2681 Unsteadiness on feet: Secondary | ICD-10-CM

## 2013-08-20 DIAGNOSIS — E119 Type 2 diabetes mellitus without complications: Secondary | ICD-10-CM

## 2013-08-20 DIAGNOSIS — E1140 Type 2 diabetes mellitus with diabetic neuropathy, unspecified: Secondary | ICD-10-CM

## 2013-08-20 DIAGNOSIS — R269 Unspecified abnormalities of gait and mobility: Secondary | ICD-10-CM

## 2013-08-20 LAB — WOUND CULTURE
Gram Stain: NONE SEEN
Gram Stain: NONE SEEN

## 2013-08-20 NOTE — Progress Notes (Signed)
Subjective:  This chart was scribed for [name] Sidle, MD by [name] [name], Medical Scribe. This patient was seen in room N/A  and the patient's care was started at 9:00 AM.    Patient ID: [name] [name], male    DOB: May 21, 1951, 62 y.o.   MRN: [id number]  HPI HPI Comments: [name] [name] is a 62 y.o. male who presents to [facility] needing a recheck for an infected rash on the left side of his forehead.  The patient states that his sugar was 200 this morning after breakfast.  The patient denies any new abnormalities in his gait.  The patient states that he has seen a neurologist in the past in [facility].    Past Medical History  Diagnosis Date  . Diabetes mellitus without complication   . Hypertension   . Hyperlipidemia   . Myocardial infarction   . Stroke    History   Social History  . Marital Status: Single    Spouse Name: N/A    Number of Children: N/A  . Years of Education: N/A   Occupational History  . Not on file.   Social History Main Topics  . Smoking status: Never Smoker   . Smokeless tobacco: Not on file  . Alcohol Use: No  . Drug Use: No  . Sexual Activity: Not on file   Other Topics Concern  . Not on file   Social History Narrative  . No narrative on file  History reviewed. No pertinent past surgical history. Family History  Problem Relation Age of Onset  . Diabetes Mother   . Heart disease Father   . Diabetes Brother    No Known Allergies Filed Vitals:   08/20/13 0834  BP: 122/70  Pulse: 71  Temp: 98 F (36.7 C)  TempSrc: Oral  Resp: 18  Weight: 220 lb (99.791 kg)  SpO2: 100%     Review of Systems  Skin: Positive for rash.  All other systems reviewed and are negative.       Objective:   Physical Exam  Nursing note and vitals reviewed. Constitutional: He is oriented to person, place, and time. He appears well-developed and well-nourished.  HENT:  Head: Normocephalic and atraumatic.  Eyes: Conjunctivae and EOM are normal. Pupils are  equal, round, and reactive to light.  Neck: Normal range of motion. Neck supple.  Cardiovascular: Normal rate, regular rhythm and normal heart sounds.   Pulmonary/Chest: Effort normal and breath sounds normal.  Abdominal: Soft. Bowel sounds are normal.  Musculoskeletal: Normal range of motion.  Neurological: He is alert and oriented to person, place, and time.  Skin: Skin is warm and dry.  Psychiatric: He has a normal mood and affect.   Patient has a positive Romberg and he cannot walk heel to toe without stumbling. The abscess on patient's head is still mildly tender but is not draining and the swelling is significantly less. DIAGNOSTIC STUDIES: Oxygen Saturation is 100% on room air, normal by my interpretation.    COORDINATION OF CARE: 9:03 AM- Advised the patient to follow up with a neurologist to treat the problems with his balance.  Discussed keeping the patient on his current medications.  Discussed discharging the patient with a prescription for medications that will last him 10 days and advised the patient to return to [facility] in 10 days to check his infection.  The patient agreed to the treatment plan.            Assessment & Plan:   No orders  of the defined types were placed in this encounter.   Problem List Items Addressed This Visit   None      1 patient to return in one week for followup. He's going to be getting a neurology referral as well. Signed, [name] Sidle, MD

## 2013-08-21 ENCOUNTER — Telehealth: Payer: Self-pay

## 2013-08-21 NOTE — Telephone Encounter (Signed)
Pharmacist called to get clarification on directions for Tramadol. Sig states 1 at bedtime may repeat in 6 hrs, but for #120 which would be a 60 day supply. Dr L, please clarify.

## 2013-08-21 NOTE — Telephone Encounter (Signed)
1 qid prn

## 2013-08-24 ENCOUNTER — Other Ambulatory Visit: Payer: Self-pay | Admitting: Family Medicine

## 2013-08-28 ENCOUNTER — Ambulatory Visit (INDEPENDENT_AMBULATORY_CARE_PROVIDER_SITE_OTHER): Payer: Medicare Other | Admitting: Family Medicine

## 2013-08-28 VITALS — BP 140/80 | HR 65 | Temp 98.2°F | Resp 18 | Ht 68.5 in | Wt 219.0 lb

## 2013-08-28 DIAGNOSIS — E1310 Other specified diabetes mellitus with ketoacidosis without coma: Secondary | ICD-10-CM

## 2013-08-28 DIAGNOSIS — E1110 Type 2 diabetes mellitus with ketoacidosis without coma: Secondary | ICD-10-CM

## 2013-08-28 DIAGNOSIS — L0390 Cellulitis, unspecified: Secondary | ICD-10-CM

## 2013-08-28 DIAGNOSIS — L0291 Cutaneous abscess, unspecified: Secondary | ICD-10-CM

## 2013-08-28 NOTE — Progress Notes (Signed)
Subjective:  This chart was scribed for [name] Sidle, MD by Quintella Reichert, ED scribe.  This patient was seen in room [facility] Room 1 and the patient's care was started at 8:50 AM.   Patient ID: [name] [name], male    DOB: [birth year], 62 y.o.   MRN: [id number]  Chief Complaint  Patient presents with  . Follow-up    knot on forehead    HPI  HPI Comments: [name] [name] is a 62 y.o. male who presents for a follow-up of a knot on his forehead.  Pt states the area is healing well to his knowledge and it has become less painful.    Pt uses Lantus at bedtime and Humulin 2x/day and checks his CBG every morning and evening.  His CBG is generally slightly higher during the evening.  His CBG was 180 when he checked it before breakfast time this morning.  He has not eaten breakfast today. He states his feet have been doing well but he does have chronic neuropathic pain and numbness in his hands.  He is scheduled to see a neurologist at the end of November.  His mother [name] passed away in her sleep 3 days ago at age 37.  She is going to be cremated next week.  There is no service being planned because her other relatives live far away.  He is planning to take her ashes to be near those of his father in [place].    He buys his own groceries and cooks his own food.  Diet includes steak and potatoes, "sometmes vegetables," salad, pizza now and then, and hamburgers now and then.  He is not eating prepackaged food often.    He states he has been walking around the block regularly.    Pt is divorced and has children in [place] who he is not able to see often.  He has some community in [place] but is mostly on his own.  He states he does know his neighbors.     Past Medical History  Diagnosis Date  . Diabetes mellitus without complication   . Hypertension   . Hyperlipidemia   . Myocardial infarction   . Stroke     Prior to Admission medications   Medication Sig Start Date End Date  Taking? Authorizing Provider  Fenofibric Acid 105 MG TABS Take 1 tablet (105 mg total) by mouth daily. NEEDS OFFICE VISIT/LABS 11/20/12  Yes [name] Sidle, MD  gabapentin (NEURONTIN) 300 MG capsule Take 1 capsule (300 mg total) by mouth 3 (three) times daily. 11/20/12  Yes [name] Sidle, MD  glucose blood (TRUETRACK TEST) test strip Use as instructed. Dx code 250.00 01/03/13  Yes Maurice March, MD  insulin glargine (LANTUS) 100 UNIT/ML injection Inject 0.4 mLs (40 Units total) into the skin daily. 08/17/13  Yes [name] Sidle, MD  insulin regular (HUMULIN R) 100 units/mL injection Inject 0.5 mLs (50 Units total) into the skin 2 (two) times daily before a meal. 11/20/12  Yes [name] Sidle, MD  ketoconazole (NIZORAL) 2 % shampoo Apply topically 2 (two) times a week. 11/20/12  Yes [name] Sidle, MD  lisinopril-hydrochlorothiazide (PRINZIDE,ZESTORETIC) 20-12.5 MG per tablet Take 1 tablet by mouth daily. 11/20/12  Yes [name] Sidle, MD  metoprolol succinate (TOPROL-XL) 25 MG 24 hr tablet TAKE 1 TABLET BY MOUTH DAILY 08/24/13  Yes [name] Sidle, MD  Multiple Vitamin (MULTIVITAMIN) tablet Take 1 tablet by mouth daily.   Yes Historical Provider, MD  rosuvastatin (CRESTOR) 10 MG tablet Take 1  tablet (10 mg total) by mouth daily. 11/22/12  Yes [name] Sidle, MD  silver sulfADIAZINE (SILVADENE) 1 % cream Apply topically daily. 08/17/13  Yes [name] Sidle, MD  traMADol Janean Sark) 50 MG tablet One at bedtime, may repeat x 1 in 6 hours 08/17/13  Yes [name] Sidle, MD  aspirin 81 MG tablet Take 81 mg by mouth daily.    Historical Provider, MD    No Known Allergies    Review of Systems     BP 140/80  Pulse 65  Temp(Src) 98.2 F (36.8 C) (Oral)  Resp 18  Ht 5' 8.5" (1.74 m)  Wt 219 lb (99.338 kg)  BMI 32.81 kg/m2  SpO2 99%   Objective:   Physical Exam  Nursing note and vitals reviewed. Constitutional: He is oriented to person, place, and time. He appears well-developed and  well-nourished. No distress.  HENT:  Head: Normocephalic and atraumatic.  Eyes: EOM are normal.  Neck: Neck supple. No tracheal deviation present.  Cardiovascular: Normal rate.   Pulmonary/Chest: Effort normal. No respiratory distress.  Musculoskeletal: Normal range of motion.  Neurological: He is alert and oriented to person, place, and time.  Skin: Skin is warm and dry.  Psychiatric: He has a normal mood and affect. His behavior is normal.          Assessment & Plan:   DM (diabetes mellitus) type 2, uncontrolled, with ketoacidosis  Cellulitis  Signed, [name] Sidle, MD

## 2013-11-23 ENCOUNTER — Encounter: Payer: Self-pay | Admitting: Family Medicine

## 2013-11-23 ENCOUNTER — Ambulatory Visit (INDEPENDENT_AMBULATORY_CARE_PROVIDER_SITE_OTHER): Payer: Medicare Other | Admitting: Family Medicine

## 2013-11-23 VITALS — BP 160/74 | HR 65 | Temp 98.0°F | Resp 16 | Ht 68.0 in | Wt 220.4 lb

## 2013-11-23 DIAGNOSIS — I2510 Atherosclerotic heart disease of native coronary artery without angina pectoris: Secondary | ICD-10-CM

## 2013-11-23 DIAGNOSIS — E119 Type 2 diabetes mellitus without complications: Secondary | ICD-10-CM

## 2013-11-23 DIAGNOSIS — E785 Hyperlipidemia, unspecified: Secondary | ICD-10-CM

## 2013-11-23 LAB — COMPREHENSIVE METABOLIC PANEL
ALT: 19 U/L (ref 0–53)
AST: 27 U/L (ref 0–37)
Albumin: 4.1 g/dL (ref 3.5–5.2)
Alkaline Phosphatase: 69 U/L (ref 39–117)
BUN: 29 mg/dL — ABNORMAL HIGH (ref 6–23)
CO2: 26 mEq/L (ref 19–32)
Calcium: 9.2 mg/dL (ref 8.4–10.5)
Chloride: 101 mEq/L (ref 96–112)
Creat: 1.5 mg/dL — ABNORMAL HIGH (ref 0.50–1.35)
Glucose, Bld: 246 mg/dL — ABNORMAL HIGH (ref 70–99)
Potassium: 4.8 mEq/L (ref 3.5–5.3)
Sodium: 138 mEq/L (ref 135–145)
Total Bilirubin: 0.5 mg/dL (ref 0.3–1.2)
Total Protein: 7.1 g/dL (ref 6.0–8.3)

## 2013-11-23 LAB — LIPID PANEL
Cholesterol: 154 mg/dL (ref 0–200)
HDL: 33 mg/dL — ABNORMAL LOW (ref >39)
LDL Cholesterol: 85 mg/dL (ref 0–99)
Total CHOL/HDL Ratio: 4.7 Ratio
Triglycerides: 181 mg/dL — ABNORMAL HIGH (ref <150)
VLDL: 36 mg/dL (ref 0–40)

## 2013-11-23 LAB — POCT GLYCOSYLATED HEMOGLOBIN (HGB A1C): Hemoglobin A1C: 7.3

## 2013-11-23 NOTE — Progress Notes (Signed)
Subjective:  This chart was scribed for [name] SidleKurt Lauenstein, MD by Carl Bestelina Holson, Medical Scribe. This patient was seen in Room 24 and the patient's care was started at 9:44 AM.   Patient ID: [name] LaurenceJerry [name], male    DOB: 05/07/1951, 63 y.o.   MRN: [id number]  HPI HPI Comments: [name] [name] is a [age] y.o. male with a history of Hypertension and Type 2 DM who presents to the [facility] needing a follow-up appointment.  The patient's A1C level at his last appointment was 8.0.  The patient states that he checks his sugar twice a day; before breakfast and before lunch.  He states that his sugar level was 176 this morning.  He denies SOB and abdominal pain as associated symptoms.  He states that he will walk around his block when the weather is nice for exercise.  He denies experiencing any new medical problems.    He states that he gets an eye exam once a year.  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    He states that he feels as though he has finally come to terms with his mother's passing and is doing fine.  He denies seeing a cardiologist in the past.  He states that he had to post[facility] [place] [facility] [place].  He states that he rescheduled the appointment for March.   The patient states that he is also in need of a new blood meter.  His mother [name] MayoJean [name] passed away in her sleep 3 months ago at age 62. She was cremated . There is no service being planned because her other relatives live far away.   He buys his own groceries and cooks his own food. Diet includes steak and potatoes, "sometmes vegetables," salad, pizza now and then, and hamburgers now and then. He is not eating prepackaged food often. He states he has been walking around the block regularly.  Pt is divorced and has children in [place] who he is not able to see often. He has some community in  [place] but is mostly on his own. He states he does know his neighbors.  Patient is receiving a settlement from the [employer] of [employer] after he suffered his MI several years ago.    Past Medical History  Diagnosis Date   Diabetes mellitus without complication    Hypertension    Hyperlipidemia    Myocardial infarction    Stroke    No past surgical history on file. Family History  Problem Relation Age of Onset   Diabetes Mother    Heart disease Father    Diabetes Brother    History   Social History   Marital Status: Single    Spouse Name: N/A    Number of Children: N/A   Years of Education: N/A   Occupational History   Not on file.   Social History Main Topics   Smoking status: Never Smoker    Smokeless tobacco: Not on file   Alcohol Use: No   Drug Use: No   Sexual Activity: Not on file   Other Topics Concern   Not on file   Social History Narrative   No narrative on file   No Known Allergies   Review of Systems  Respiratory: Negative for shortness of breath.   Gastrointestinal: Negative for abdominal pain.  All other systems  reviewed and are negative.     Objective:  Physical Exam  Nursing note and vitals reviewed. Constitutional: He is oriented to person, place, and time. He appears well-developed and well-nourished. No distress.  HENT:  Head: Normocephalic and atraumatic.  Eyes: Pupils are equal, round, and reactive to light.  Left exophoria, blind left eye  Neck: Neck supple. No tracheal deviation present.  Cardiovascular: Normal rate, regular rhythm and normal heart sounds.  Exam reveals no gallop and no friction rub.   No murmur heard. Pulmonary/Chest: Effort normal. No respiratory distress.  Musculoskeletal: Normal range of motion.  Neurological: He is alert and oriented to person, place, and time.  Skin: Skin is warm and dry.  Psychiatric: He has a normal mood and affect. His behavior is normal.    Monofilament:   Normal.  Skin on feet is somewhat scaly but there is no callus formation or ulceration.  No interdigital maceration and the pulses are normal.  EKG:  Some lateral ST wave changes.  Results for orders placed in visit on 11/23/13  POCT GLYCOSYLATED HEMOGLOBIN (HGB A1C)      Result Value Range   Hemoglobin A1C 7.3        BP 160/74   Pulse 65   Temp(Src) 98 F (36.7 C) (Oral)   Resp 16   Ht 5\' 8"  (1.727 m)   Wt 220 lb 6.4 oz (99.973 kg)   BMI 33.52 kg/m2   SpO2 98%  Assessment & Plan:  It's been 7 a half years since [name] had a stress test and it is probably time to have that repeated. He's doing well with his diabetes as evidenced by the A1c.  Recheck in 3 months, Rx written for new glucometer. Type II or unspecified type diabetes mellitus without mention of complication, not stated as uncontrolled - Plan: HM Diabetes Foot Exam, Microalbumin, urine, POCT glycosylated hemoglobin (Hb A1C), Comprehensive metabolic panel, Lipid panel  Hyperlipidemia - Plan: Comprehensive metabolic panel, Lipid panel  CAD (coronary artery disease) - Plan: EKG 12-Lead, EKG 12-Lead    I personally performed the services described in this documentation, which was scribed in my presence. The recorded information has been reviewed and is accurate.  [name] Sidle, MD

## 2013-11-24 LAB — MICROALBUMIN, URINE: Microalb, Ur: 149.12 mg/dL — ABNORMAL HIGH (ref 0.00–1.89)

## 2013-11-25 ENCOUNTER — Other Ambulatory Visit: Payer: Self-pay | Admitting: Family Medicine

## 2013-11-28 ENCOUNTER — Other Ambulatory Visit: Payer: Self-pay | Admitting: Family Medicine

## 2013-12-19 ENCOUNTER — Other Ambulatory Visit: Payer: Self-pay | Admitting: Family Medicine

## 2014-01-15 ENCOUNTER — Other Ambulatory Visit: Payer: Self-pay | Admitting: Family Medicine

## 2014-01-24 ENCOUNTER — Ambulatory Visit (INDEPENDENT_AMBULATORY_CARE_PROVIDER_SITE_OTHER): Payer: Medicare Other | Admitting: Family Medicine

## 2014-01-24 VITALS — BP 152/72 | HR 74 | Temp 98.3°F | Resp 18 | Ht 69.0 in | Wt 217.0 lb

## 2014-01-24 DIAGNOSIS — L02818 Cutaneous abscess of other sites: Secondary | ICD-10-CM

## 2014-01-24 DIAGNOSIS — E1149 Type 2 diabetes mellitus with other diabetic neurological complication: Secondary | ICD-10-CM

## 2014-01-24 DIAGNOSIS — L03818 Cellulitis of other sites: Secondary | ICD-10-CM

## 2014-01-24 DIAGNOSIS — L03811 Cellulitis of head [any part, except face]: Secondary | ICD-10-CM

## 2014-01-24 DIAGNOSIS — E1142 Type 2 diabetes mellitus with diabetic polyneuropathy: Secondary | ICD-10-CM

## 2014-01-24 DIAGNOSIS — W01119A Fall on same level from slipping, tripping and stumbling with subsequent striking against unspecified sharp object, initial encounter: Secondary | ICD-10-CM

## 2014-01-24 MED ORDER — MUPIROCIN 2 % EX OINT: 1.0000 "application " | TOPICAL_OINTMENT | Freq: Three times a day (TID) | CUTANEOUS | Status: DC

## 2014-01-24 MED ORDER — CEFTRIAXONE SODIUM 1 G IJ SOLR
1.0000 g | Freq: Once | INTRAMUSCULAR | Status: AC
  Administered 2014-01-24: 1 g via INTRAMUSCULAR

## 2014-01-24 MED ORDER — SULFAMETHOXAZOLE-TMP DS 800-160 MG PO TABS: 1.0000 | ORAL_TABLET | Freq: Two times a day (BID) | ORAL | Status: DC

## 2014-01-24 NOTE — Progress Notes (Signed)
Subjective: General he is a patient who has been coming here for a long time. He has advanced diabetic neuropathy, which caused him to be a little unsteady. He fell and hit his right side of his head onto the corner of a him to able to. This happened about 10 days ago. He has been self treating it, but it has gotten worse in his crusting and draining pus.   Objective: Approximately 5 x 4 cm area of abscessing with a 2 CM central wound which is very crusted and full of hair.   Debridement note:  This was scrubbed and debrided using pickups and sharp scissors. A large amount of pus was expressed. It seems to be multi-follicular and it came from different cavities. There is an area of cellulitis extending about 5 or 6 cm below the ear. The ear lobe itself is spared. This is all over the mastoid region. Surrounding hair was trimmed so to be kept clean.  Assessment: Wound and abscess and cellulitis behind right ear Diabetes mellitus Fall secondary to peripheral neuropathy  Plan: Dressed with Bactroban Culture 1 g ceftriaxone IM Bactrim one twice daily Return in 2 days for recheck, sooner if worse

## 2014-01-24 NOTE — Patient Instructions (Signed)
Take Bactrim one twice daily. This is a sulfa drug. If you develop an allergy it will probably be a red rash and stop the medication immediately  You can wash the area in the shower, and dressed with mupirocin (Bactroban)  Return in 2 days, sooner if worse

## 2014-01-26 LAB — WOUND CULTURE
GRAM STAIN: NONE SEEN
Gram Stain: NONE SEEN

## 2014-01-27 ENCOUNTER — Ambulatory Visit (INDEPENDENT_AMBULATORY_CARE_PROVIDER_SITE_OTHER): Payer: Medicare Other | Admitting: Family Medicine

## 2014-01-27 VITALS — BP 126/80 | HR 66 | Temp 98.2°F | Resp 17 | Ht 68.5 in | Wt 219.0 lb

## 2014-01-27 DIAGNOSIS — L0390 Cellulitis, unspecified: Principal | ICD-10-CM

## 2014-01-27 DIAGNOSIS — L0291 Cutaneous abscess, unspecified: Secondary | ICD-10-CM

## 2014-01-27 NOTE — Patient Instructions (Signed)
Continue current care. Return in 2 days for another recheck.

## 2014-01-27 NOTE — Progress Notes (Addendum)
Subjective: [name] [name] is here for recheck with regard to the wound on his head. It continues to drain a lot.  Objective: The redness is a little less red. He still draining fairly copious amounts of pus. The culture came back staph aureus, and he is sensitive to the Bactrim. The wound is doing a little bit better is still quite infected.  Wound was cleaned and superficially debrided by Rhoderick MoodyHeather Marte, PAC.    Assessment: Abscess ulceration right post-auricular area Superficial debridement  Plan: [facility] will help clean it out and dressed it.

## 2014-02-03 ENCOUNTER — Ambulatory Visit (INDEPENDENT_AMBULATORY_CARE_PROVIDER_SITE_OTHER): Payer: Medicare Other | Admitting: Family Medicine

## 2014-02-03 VITALS — BP 120/64 | HR 57 | Temp 97.6°F | Resp 18 | Ht 68.5 in | Wt 219.0 lb

## 2014-02-03 DIAGNOSIS — L02818 Cutaneous abscess of other sites: Secondary | ICD-10-CM

## 2014-02-03 DIAGNOSIS — L03818 Cellulitis of other sites: Secondary | ICD-10-CM

## 2014-02-03 DIAGNOSIS — L03811 Cellulitis of head [any part, except face]: Secondary | ICD-10-CM

## 2014-02-03 MED ORDER — SULFAMETHOXAZOLE-TMP DS 800-160 MG PO TABS: 1.0000 | ORAL_TABLET | Freq: Two times a day (BID) | ORAL | Status: DC

## 2014-02-03 NOTE — Progress Notes (Signed)
Subjective: Patient is here for recheck of the place behind his right ear. It is doing a little better. He cannot see it to really a take perfect care of it.  Objective: 1.3 cm ulceration behind his right ear. The surrounding tissue is not nearly as swollen though still has a purplish discoloration to it. There is an eschar in the crater. Wound is about 3 mm in depth.  This was debrided using sharp debridement. The patient tolerated this well. Wound was clean. Still has pussy debris down in the wound.  Assessment: Ulceration right posterior scalp  Plan: Clean it at home Return in one week Continue the same antibiotic

## 2014-02-03 NOTE — Patient Instructions (Addendum)
Wash the area twice daily  Return next Saturday for a recheck  Return sooner if problems or concerns

## 2014-02-10 ENCOUNTER — Ambulatory Visit (INDEPENDENT_AMBULATORY_CARE_PROVIDER_SITE_OTHER): Payer: Medicare Other | Admitting: Family Medicine

## 2014-02-10 ENCOUNTER — Encounter: Payer: Self-pay | Admitting: Family Medicine

## 2014-02-10 VITALS — BP 120/66 | HR 54 | Temp 97.2°F | Resp 16 | Ht 67.5 in | Wt 216.0 lb

## 2014-02-10 DIAGNOSIS — L02811 Cutaneous abscess of head [any part, except face]: Secondary | ICD-10-CM

## 2014-02-10 DIAGNOSIS — L03818 Cellulitis of other sites: Secondary | ICD-10-CM

## 2014-02-10 DIAGNOSIS — L02818 Cutaneous abscess of other sites: Secondary | ICD-10-CM

## 2014-02-10 MED ORDER — DOXYCYCLINE HYCLATE 100 MG PO CAPS: 100.0000 mg | ORAL_CAPSULE | Freq: Two times a day (BID) | ORAL | Status: DC

## 2014-02-10 NOTE — Patient Instructions (Signed)
Continue to watch the lesion twice daily  Finish the current antibiotic, then begin doxycycline one twice daily  Return in 6 days as directed after 10 AM on Friday

## 2014-02-10 NOTE — Progress Notes (Signed)
Subjective: Patient is here for a recheck of the lesion behind his right ear. The abscess cavity is again crusted over and is form a thickened eschar which is firmly attached. He washes it but it is not all come out.  Objective: The abscess cavity is about 1 x 1.5 cm in size. It is fairly deep. Debridement a significant eschar out of it. The hair surrounding it was trimmed so it will keep growing into it. The surrounding erythema is continued to gradually improve.  Assessment: Abscess behind right ear on edge of scalp  Plan: Return again next week. He has an appointment with Dr. Elbert EwingsL. in 2 weeks.

## 2014-02-16 ENCOUNTER — Ambulatory Visit (INDEPENDENT_AMBULATORY_CARE_PROVIDER_SITE_OTHER): Payer: Medicare Other | Admitting: Family Medicine

## 2014-02-16 ENCOUNTER — Telehealth: Payer: Self-pay

## 2014-02-16 VITALS — BP 114/50 | HR 57 | Temp 98.7°F | Resp 18 | Ht 67.5 in | Wt 219.0 lb

## 2014-02-16 DIAGNOSIS — R296 Repeated falls: Secondary | ICD-10-CM

## 2014-02-16 DIAGNOSIS — G609 Hereditary and idiopathic neuropathy, unspecified: Secondary | ICD-10-CM

## 2014-02-16 DIAGNOSIS — Z9181 History of falling: Secondary | ICD-10-CM

## 2014-02-16 DIAGNOSIS — Z5189 Encounter for other specified aftercare: Secondary | ICD-10-CM

## 2014-02-16 DIAGNOSIS — E119 Type 2 diabetes mellitus without complications: Secondary | ICD-10-CM

## 2014-02-16 MED ORDER — GLUCOSE BLOOD VI STRP: ORAL_STRIP | Status: DC

## 2014-02-16 MED ORDER — INSULIN REGULAR HUMAN 100 UNIT/ML IJ SOLN: INTRAMUSCULAR | Status: DC

## 2014-02-16 NOTE — Telephone Encounter (Signed)
Sent in

## 2014-02-16 NOTE — Patient Instructions (Signed)
Same. Return in one week

## 2014-02-16 NOTE — Telephone Encounter (Signed)
Pt is needing to have his insulin and test strips called in

## 2014-02-16 NOTE — Progress Notes (Signed)
Subjective: [name] [name] is here for recheck of the wound behind his right ear. It appears to be gradually getting smaller, though there is still a deep cavity where [name] [name] hit the corner of the table when his original fall happened. His biggest concern is that [name] [name] has gotten more unstable with his gait. [name] [name] has peripheral neuropathy and falls easily. Yesterday [name] [name] hit his head when [name] [name] fell and had to call the ambulance [name] [name] checked him out and did not feel like [name] [name] had a concussion. [name] [name]'s been doing okay since then.  Objective: Wound behind the ear looks smaller. This was again cleaned and debrided. It is slowly improving. There is still the area of discolored maroonish skin around it, but it no longer looks actively inflamed.  Assessment: Skin ulceration behind right ear Peripheral neuropathy and gait disturbance  Plan: Refer for physical therapy evaluation. May need a walker, but would like to see what PT thinks [name] [name] needs first. His friends tell him to get a wheelchair, but is not ready for one  yet.

## 2014-02-16 NOTE — Telephone Encounter (Signed)
1) Is the patient still injecting his Humalin R 50 units twice daily with meals?   2) Is the patient still injecting the Lantus 40 units qhs?   3) Does the patient need refills on both of these medications?

## 2014-02-16 NOTE — Telephone Encounter (Signed)
Pt only needs a RF of Humalin and test strips. He is on a sliding scale He still has RF's of the Lantus

## 2014-02-17 NOTE — Telephone Encounter (Signed)
Pt notified that rx's have been sent in 

## 2014-02-19 ENCOUNTER — Other Ambulatory Visit: Payer: Self-pay | Admitting: Family Medicine

## 2014-02-21 ENCOUNTER — Telehealth: Payer: Self-pay | Admitting: Radiology

## 2014-02-21 NOTE — Telephone Encounter (Signed)
[facility] in [place] called asking refill for this pt for:  Crestor 10mg  Lisinopril/HCTZ 20/12.5

## 2014-02-22 ENCOUNTER — Ambulatory Visit: Payer: Medicare Other | Admitting: Family Medicine

## 2014-03-02 ENCOUNTER — Other Ambulatory Visit: Payer: Self-pay | Admitting: Family Medicine

## 2014-03-02 MED ORDER — METOPROLOL SUCCINATE ER 25 MG PO TB24: ORAL_TABLET | ORAL | Status: DC

## 2014-03-02 NOTE — Telephone Encounter (Signed)
Checked w/pharm when received req for metoprolol tartrate only QD to see if they could tell when pt started taking the tartrate. We have on med list Hx that pt has been taking succinate 24 hr tab. They verified that when Rx was transferred to them last month it came in as succinate and they typed in tartrate in error. I refused the tartrate and resent correct Rx for succinate.  Dr L, please review req for tramadol.

## 2014-03-05 ENCOUNTER — Encounter: Payer: Self-pay | Admitting: Family Medicine

## 2014-03-05 ENCOUNTER — Ambulatory Visit (INDEPENDENT_AMBULATORY_CARE_PROVIDER_SITE_OTHER): Payer: Medicare Other | Admitting: Family Medicine

## 2014-03-05 VITALS — BP 160/70 | HR 58 | Temp 97.9°F | Resp 16 | Ht 68.0 in | Wt 212.8 lb

## 2014-03-05 DIAGNOSIS — E1140 Type 2 diabetes mellitus with diabetic neuropathy, unspecified: Secondary | ICD-10-CM

## 2014-03-05 DIAGNOSIS — E1142 Type 2 diabetes mellitus with diabetic polyneuropathy: Secondary | ICD-10-CM

## 2014-03-05 DIAGNOSIS — E785 Hyperlipidemia, unspecified: Secondary | ICD-10-CM

## 2014-03-05 DIAGNOSIS — E11620 Type 2 diabetes mellitus with diabetic dermatitis: Secondary | ICD-10-CM

## 2014-03-05 DIAGNOSIS — E119 Type 2 diabetes mellitus without complications: Secondary | ICD-10-CM

## 2014-03-05 DIAGNOSIS — E1149 Type 2 diabetes mellitus with other diabetic neurological complication: Secondary | ICD-10-CM

## 2014-03-05 DIAGNOSIS — E1169 Type 2 diabetes mellitus with other specified complication: Secondary | ICD-10-CM

## 2014-03-05 DIAGNOSIS — L988 Other specified disorders of the skin and subcutaneous tissue: Secondary | ICD-10-CM

## 2014-03-05 DIAGNOSIS — I10 Essential (primary) hypertension: Secondary | ICD-10-CM

## 2014-03-05 DIAGNOSIS — E11628 Type 2 diabetes mellitus with other skin complications: Secondary | ICD-10-CM

## 2014-03-05 LAB — COMPREHENSIVE METABOLIC PANEL
ALT: 26 U/L (ref 0–53)
AST: 29 U/L (ref 0–37)
Albumin: 4.3 g/dL (ref 3.5–5.2)
Alkaline Phosphatase: 57 U/L (ref 39–117)
BUN: 27 mg/dL — ABNORMAL HIGH (ref 6–23)
CO2: 30 mEq/L (ref 19–32)
Calcium: 9.3 mg/dL (ref 8.4–10.5)
Chloride: 102 mEq/L (ref 96–112)
Creat: 1.52 mg/dL — ABNORMAL HIGH (ref 0.50–1.35)
Glucose, Bld: 173 mg/dL — ABNORMAL HIGH (ref 70–99)
Potassium: 4.7 mEq/L (ref 3.5–5.3)
Sodium: 139 mEq/L (ref 135–145)
Total Bilirubin: 0.5 mg/dL (ref 0.2–1.2)
Total Protein: 7.1 g/dL (ref 6.0–8.3)

## 2014-03-05 LAB — GLUCOSE, POCT (MANUAL RESULT ENTRY): POC Glucose: 174 mg/dl — AB (ref 70–99)

## 2014-03-05 LAB — LIPID PANEL
Cholesterol: 148 mg/dL (ref 0–200)
HDL: 33 mg/dL — ABNORMAL LOW (ref >39)
LDL Cholesterol: 61 mg/dL (ref 0–99)
Total CHOL/HDL Ratio: 4.5 Ratio
Triglycerides: 272 mg/dL — ABNORMAL HIGH (ref <150)
VLDL: 54 mg/dL — ABNORMAL HIGH (ref 0–40)

## 2014-03-05 LAB — POCT GLYCOSYLATED HEMOGLOBIN (HGB A1C): Hemoglobin A1C: 7.6

## 2014-03-05 MED ORDER — INSULIN GLARGINE 100 UNIT/ML ~~LOC~~ SOLN: 40.0000 [IU] | Freq: Every day | SUBCUTANEOUS | Status: DC

## 2014-03-05 MED ORDER — TRAMADOL HCL 50 MG PO TABS: 50.0000 mg | ORAL_TABLET | Freq: Four times a day (QID) | ORAL | Status: DC | PRN

## 2014-03-05 MED ORDER — INSULIN REGULAR HUMAN 100 UNIT/ML IJ SOLN: INTRAMUSCULAR | Status: DC

## 2014-03-05 MED ORDER — GLUCOSE BLOOD VI STRP
ORAL_STRIP | Status: AC
Start: 2014-03-05 — End: ?

## 2014-03-05 MED ORDER — ROSUVASTATIN CALCIUM 10 MG PO TABS: 10.0000 mg | ORAL_TABLET | Freq: Every day | ORAL | Status: DC

## 2014-03-05 MED ORDER — METOPROLOL TARTRATE 25 MG PO TABS: 25.0000 mg | ORAL_TABLET | Freq: Two times a day (BID) | ORAL | Status: DC

## 2014-03-05 MED ORDER — GABAPENTIN 300 MG PO CAPS: 300.0000 mg | ORAL_CAPSULE | Freq: Three times a day (TID) | ORAL | Status: DC

## 2014-03-05 MED ORDER — FENOFIBRIC ACID 105 MG PO TABS
ORAL_TABLET | ORAL | Status: DC
Start: 2014-03-05 — End: 2015-05-29

## 2014-03-05 NOTE — Telephone Encounter (Signed)
Faxed tramadol. 

## 2014-03-05 NOTE — Patient Instructions (Signed)
Diabetic Neuropathy Diabetic neuropathy is a nerve disease or nerve damage that is caused by diabetes mellitus. About half of all people with diabetes mellitus have some form of nerve damage. Nerve damage is more common in those who have had diabetes mellitus for many years and who generally have not had good control of their blood sugar (glucose) level. Diabetic neuropathy is a common complication of diabetes mellitus. There are three more common types of diabetic neuropathy and a fourth type that is less common and less understood:   Peripheral neuropathy This is the most common type of diabetic neuropathy. It causes damage to the nerves of the feet and legs first and then eventually the hands and arms.The damage affects the ability to sense touch.  Autonomic neuropathy This type causes damage to the autonomic nervous system, which controls the following functions:  Heartbeat.  Body temperature.  Blood pressure.  Urination.  Digestion.  Sweating.  Sexual function.  Focal neuropathy Focal neuropathy can be painful and unpredictable and occurs most often in older adults with diabetes mellitus. It involves a specific nerve or one area and often comes on suddenly. It usually does not cause long-term problems.  Radiculoplexus neuropathy Sometimes called lumbosacral radiculoplexus neuropathy, radiculoplexus neuropathy affects the nerves of the thighs, hips, buttocks, or legs. It is more common in people with type 2 diabetes mellitus and in older men. It is characterized by debilitating pain, weakness, and atrophy, usually in the thigh muscles. CAUSES  The cause of peripheral, autonomic, and focal neuropathies is diabetes mellitus that is uncontrolled and high glucose levels. The cause of radiculoplexus neuropathy is unknown. However, it is thought to be caused by inflammation related to uncontrolled glucose levels. SIGNS AND SYMPTOMS  Peripheral Neuropathy Peripheral neuropathy develops  slowly over time. When the nerves of the feet and legs no longer work there may be:   Burning, stabbing, or aching pain in the legs or feet.  Inability to feel pressure or pain in your feet. This can lead to:  Thick calluses over pressure areas.  Pressure sores.  Ulcers.  Foot deformities.  Reduced ability to feel temperature changes.  Muscle weakness. Autonomic Neuropathy The symptoms of autonomic neuropathy vary depending on which nerves are affected. Symptoms may include:  Problems with digestion, such as:  Feeling sick to your stomach (nausea).  Vomiting.  Bloating.  Constipation.  Diarrhea.  Abdominal pain.  Difficulty with urination. This occurs if you lose your ability to sense when your bladder is full. Problems include:  Urine leakage (incontinence).  Inability to empty your bladder completely (retention).  Rapid or irregular heartbeat (palpitations).  Blood pressure drops when you stand up (orthostatic hypotension). When you stand up you may feel:  Dizzy.  Weak.  Faint.  In men, inability to attain and maintain an erection.  In women, vaginal dryness and problems with decreased sexual desire and arousal.  Problems with body temperature regulation.  Increased or decreased sweating. Focal Neuropathy  Abnormal eye movements or abnormal alignment of both eyes.  Weakness in the wrist.  Foot drop. This results in an inability to lift the foot properly and abnormal walking or foot movement.  Paralysis on one side of your face (Bell palsy).  Chest or abdominal pain. Radiculoplexus Neuropathy  Sudden, severe pain in your hip, thigh, or buttocks.  Weakness and wasting of thigh muscles.  Difficulty rising from a seated position.  Abdominal swelling.  Unexplained weight loss (usually more than 10 lb [4.5 kg]). DIAGNOSIS  Peripheral Neuropathy   Your senses may be tested. Sensory function testing can be done with:  A light touch using a  monofilament.  A vibration with tuning fork.  A sharp sensation with a pin prick. Other tests that can help diagnose neuropathy are:  Nerve conduction velocity. This test checks the transmission of an electrical current through a nerve.  Electromyography. This shows how muscles respond to electrical signals transmitted by nearby nerves.  Quantitative sensory testing. This is used to assess how your nerves respond to vibrations and changes in temperature. Autonomic Neuropathy Diagnosis is often based on reported symptoms. Tell your health care provider if you experience:   Dizziness.   Constipation.   Diarrhea.   Inappropriate urination or inability to urinate.   Inability to get or maintain an erection.  Tests that may be done include:   Electrocardiography or Holter monitor. These are tests that can help show problems with the heart rate or heart rhythm.   An X-ray exam may be done. Focal Neuropathy Diagnosis is made based on your symptoms and what your health care provider finds during your exam. Other tests may be done. They may include:  Nerve conduction velocities. This checks the transmission of electrical current through a nerve.  Electromyography. This shows how muscles respond to electrical signals transmitted by nearby nerves.  Quantitative sensory testing. This test is used to assess how your nerves respond to vibration and changes in temperature. Radiculoplexus Neuropathy  Often the first thing is to eliminate any other issue or problems that might be the cause, as there is no stick test for diagnosis.  X-ray exam of your spine and lumbar region.  Spinal tap to rule out cancer.  MRI to rule out other lesions. TREATMENT  Once nerve damage occurs, it cannot be reversed. The goal of treatment is to keep the disease or nerve damage from getting worse and affecting more nerve fibers. Controlling your blood glucose level is the key. Most people with  radiculoplexus neuropathy see at least a partial improvement over time. You will need to keep your blood glucose and HbA1c levels in the target range determined by your health care provider. Things that help control blood glucose levels include:   Blood glucose monitoring.   Meal planning.   Physical activity.   Diabetes medicine.  Over time, maintaining lower blood glucose levels helps lessen symptoms. Sometimes, prescription pain medicine is needed. HOME CARE INSTRUCTIONS:  Do not smoke.  Keep your blood glucose level in the range that you and your health care provider have determined acceptable for you.  Keep your blood pressure level in the range that you and your health care provider have determined acceptable for you.  Eat a well-balanced diet.  Be active every day.  Check your feet every day. SEEK MEDICAL CARE IF:   You have burning, stabbing, or aching pain in the legs or feet.  You are unable to feel pressure or pain in your feet.  You develop problems with digestion such as:  Nausea.  Vomiting.  Bloating.  Constipation.  Diarrhea.  Abdominal pain.  You have difficulty with urination, such as:  Incontinence.  Retention.  You have palpitations.  You develop orthostatic hypotension. When you stand up you may feel:  Dizzy.  Weak.  Faint.  You cannot attain and maintain an erection (in men).  You have vaginal dryness and problems with decreased sexual desire and arousal (in women).  You have severe pain in your thighs, legs, or buttocks.  You have   unexplained weight loss. Document Released: 12/28/2001 Document Revised: 08/09/2013 Document Reviewed: 03/30/2013 [employer] Patient Information 2014 [employer].  

## 2014-03-05 NOTE — Progress Notes (Signed)
Patient ID: [name] [name], male   DOB: Sep 01, 1951, 63 y.o.   MRN: [id number]   Patient ID: [name] [name] MRN: [id number], DOB: 1951/07/09, 63 y.o. Date of Encounter: 03/05/2014, 10:13 AM  Primary Physician: [name], MD  Chief Complaint: Diabetes follow up  HPI: 63 y.o. year old male with history below presents for follow up of diabetes mellitus, med refill, and a wound to the head from a fall.   Pt reports he fell hitting his the area of his head right behind his right ear against the corner of an end table. He states he just finally got his hair cut. Pt states his DM is doing okay. He states his neuropathy is acting up and he has noted an increase in the amount he falls. He states he fell twice recently but his neighbors have been checking up on him. He denies dizziness when he falls and states that his knees just give out. He states sometimes he can feel his knees begin to go and make it to a chair. Pt has scabbing and scaling to the lower extremities as a result of DM  He states his vision is unchanged since he was last here. He cannot see out of his left eye. He reports a h/o cataract surgery on both eyes.   Pt denies waking up at night with sweats. He states he did have hypoglycemic attacks while in [place] a few years ago; however, he believes it was because of his medicine.   Pt's children live in [place] and he has a 36 year old granddaughter. His daughter lives [place] and his son is [place] of [place]. He states he will drive, because he cannot fly. Since flying from [place] when he passed out and had to stay 10 days in a hospital there before being allowed to fly again, he feels flying is too risky. Pt states his father married he and his ex wife.     Past Medical History  Diagnosis Date   Diabetes mellitus without complication    Hypertension    Hyperlipidemia    Myocardial infarction    Stroke      Home Meds: Prior to Admission medications   Medication Sig Start  Date End Date Taking? Authorizing Provider  aspirin 81 MG tablet Take 81 mg by mouth daily.   Yes Historical Provider, MD  CRESTOR 10 MG tablet TAKE 1 TABLET BY MOUTH DAILY 11/25/13  Yes [name], MD  Fenofibric Acid 105 MG TABS Take 1 tablet by mouth daily. 11/29/13  Yes [name], MD  gabapentin (NEURONTIN) 300 MG capsule TAKE ONE CAPSULE BY MOUTH THREE TIMES DAILY   Yes Morrell Riddle, PA-C  glucose blood (TRUETRACK TEST) test strip Use as instructed. Dx code 250.00 02/16/14  Yes Ryan M Dunn, PA-C  insulin glargine (LANTUS) 100 UNIT/ML injection Inject 0.4 mLs (40 Units total) into the skin daily. 08/17/13  Yes [name], MD  insulin regular (HUMULIN R) 100 units/mL injection As directed by PCP. Needs office visit with labs. 02/16/14  Yes Ryan M Dunn, PA-C  ketoconazole (NIZORAL) 2 % shampoo Apply topically 2 (two) times a week. 11/20/12  Yes [name], MD  lisinopril-hydrochlorothiazide (PRINZIDE,ZESTORETIC) 20-12.5 MG per tablet TAKE 1 TABLET BY MOUTH DAILY 11/25/13  Yes [name], MD  metoprolol succinate (TOPROL-XL) 25 MG 24 hr tablet TAKE 1 TABLET BY MOUTH DAILY 03/02/14  Yes [name], MD  Multiple Vitamin (MULTIVITAMIN) tablet Take 1 tablet by mouth daily.   Yes Historical  Provider, MD  OVER THE COUNTER MEDICATION Aleve 2 tablets taking for pain   Yes Historical Provider, MD  traMADol (ULTRAM) 50 MG tablet TAKE 1 TABLET AT BEDTIME MAY REPEAT EVERY 6 HOURS   Yes [name], MD  doxycycline (VIBRAMYCIN) 100 MG capsule Take 1 capsule (100 mg total) by mouth 2 (two) times daily. 02/10/14   Peyton Najjaravid H Hopper, MD  metoprolol tartrate (LOPRESSOR) 25 MG tablet TAKE 1 TABLET EVERY DAY 02/19/14   [name], MD  mupirocin ointment (BACTROBAN) 2 % Apply 1 application topically 3 (three) times daily. 01/24/14   Peyton Najjaravid H Hopper, MD  sulfamethoxazole-trimethoprim (BACTRIM DS) 800-160 MG per tablet Take 1 tablet by mouth 2 (two) times daily. 02/03/14   Peyton Najjaravid H Hopper, MD     Allergies: Not on File  History   Social History   Marital Status: Single    Spouse Name: N/A    Number of Children: N/A   Years of Education: N/A   Occupational History   Not on file.   Social History Main Topics   Smoking status: Never Smoker    Smokeless tobacco: Not on file   Alcohol Use: No   Drug Use: No   Sexual Activity: Not on file   Other Topics Concern   Not on file   Social History Narrative   No narrative on file     Review of Systems: neuorpathy, gait disturbance, blindness in left eye Constitutional: negative for chills, fever, night sweats, weight changes, or fatigue  HEENT: negative for vision changes, hearing loss, congestion, rhinorrhea, or epistaxis Cardiovascular: negative for chest pain, palpitations, diaphoresis, DOE, orthopnea, or edema Respiratory: negative for hemoptysis, wheezing, shortness of breath, dyspnea, or cough Abdominal: negative for abdominal pain, nausea, vomiting, diarrhea, or constipation Dermatological: negative for rash, erythema, or wounds Neurologic: negative for headache, dizziness, or syncope Renal:  Negative for polyuria, polydipsia, or dysuria All other systems reviewed and are otherwise negative with the exception to those above and in the HPI.   Physical Exam: Blood pressure 160/70, pulse 58, temperature 97.9 F (36.6 C), temperature source Oral, resp. rate 16, height 5\' 8"  (1.727 m), weight 212 lb 12.8 oz (96.525 kg), SpO2 100.00%., Body mass index is 32.36 kg/(m^2). General: Well developed, well nourished, in no acute distress. Eyes: Blind in left eye. Head: Normocephalic, atraumatic, eyes without discharge, sclera non-icteric, nares are without discharge. Bilateral auditory canals clear, TM's are without perforation, pearly grey and translucent with reflective cone of light bilaterally. Oral cavity moist, posterior pharynx without exudate, erythema, peritonsillar abscess, or post nasal drip.  Neck:  Supple. No thyromegaly. Full ROM. No lymphadenopathy. Lungs: Clear bilaterally to auscultation without wheezes, rales, or rhonchi. Breathing is unlabored. Heart: RRR with S1 S2. No murmurs, rubs, or gallops appreciated. Abdomen: Soft, non-tender, non-distended with normoactive bowel sounds. No hepatosplenomegaly. No rebound/guarding. No obvious abdominal masses. Msk:  Strength and tone normal for age. Extremities/Skin: Warm and dry. No clubbing or cyanosis. No edema. No rashes, wounds, or suspicious lesions. Monofilament exam  Abnormal:  Decrease feeling both lower extremities.  Neuro: Alert and oriented X 3. Moves all extremities spontaneously. Gait is normal. CNII-XII grossly in tact. Psych:  Responds to questions appropriately with a normal affect.   Labs: Results for orders placed in visit on 03/05/14  POCT GLYCOSYLATED HEMOGLOBIN (HGB A1C)      Result Value Ref Range   Hemoglobin A1C 7.6    GLUCOSE, POCT (MANUAL RESULT ENTRY)      Result Value Ref  Range   POC Glucose 174 (*) 70 - 99 mg/dl     ASSESSMENT AND PLAN:  1. Diabetes   2. Type II or unspecified type diabetes mellitus without mention of complication, not stated as uncontrolled   Diabetes - Plan: insulin glargine (LANTUS) 100 UNIT/ML injection  Type II or unspecified type diabetes mellitus without mention of complication, not stated as uncontrolled - Plan: insulin regular (HUMULIN R) 100 units/mL injection, Comprehensive metabolic panel, Lipid panel, POCT glycosylated hemoglobin (Hb A1C), POCT glucose (manual entry)  Diabetic dermopathy - Plan: glucose blood (TRUETRACK TEST) test strip  Diabetic neuropathy - Plan: traMADol (ULTRAM) 50 MG tablet, gabapentin (NEURONTIN) 300 MG capsule  Hypertension - Plan: metoprolol tartrate (LOPRESSOR) 25 MG tablet  Hyperlipidemia - Plan: Fenofibric Acid 105 MG TABS, rosuvastatin (CRESTOR) 10 MG tablet  Necrobiosis lipoidica diabeticorum   -  Signed, [name],  MD 03/05/2014 10:13 AM

## 2014-03-09 NOTE — Telephone Encounter (Signed)
Lisinopril was not filled at 03/05/14 visit wants to know if he is still supposed to still be taking it.

## 2014-03-09 NOTE — Telephone Encounter (Signed)
Patient states he has two scripts for metroprol and none for linsinoprol.  Wants to know if his medication list is correct.  Please call [phone]

## 2014-03-12 MED ORDER — LISINOPRIL-HYDROCHLOROTHIAZIDE 20-12.5 MG PO TABS: 1.0000 | ORAL_TABLET | Freq: Every day | ORAL | Status: DC

## 2014-03-12 NOTE — Telephone Encounter (Signed)
LMVM that lisinopril was sent to pharmacy.  Please follow up with Dr. Elbert EwingsL as planned.

## 2014-03-12 NOTE — Telephone Encounter (Signed)
I suspect that he is supposed to continue the lisinopril.  I have sent this to the pharmacy.  Follow up with Dr. Milus GlazierLauenstein as planned

## 2014-04-20 ENCOUNTER — Other Ambulatory Visit: Payer: Self-pay | Admitting: Physician Assistant

## 2014-05-20 ENCOUNTER — Other Ambulatory Visit: Payer: Self-pay | Admitting: Family Medicine

## 2014-05-21 ENCOUNTER — Telehealth: Payer: Self-pay | Admitting: *Deleted

## 2014-05-21 NOTE — Telephone Encounter (Signed)
Faxed prescription to [facility] ([address] [place], per Dr Milus GlazierLauenstein. Confirmation page received at 5:24 pm

## 2014-05-31 ENCOUNTER — Encounter: Payer: Self-pay | Admitting: Family Medicine

## 2014-05-31 ENCOUNTER — Ambulatory Visit (INDEPENDENT_AMBULATORY_CARE_PROVIDER_SITE_OTHER): Payer: Medicare Other | Admitting: Family Medicine

## 2014-05-31 VITALS — BP 130/78 | HR 59 | Temp 98.1°F | Resp 16 | Ht 68.0 in | Wt 227.8 lb

## 2014-05-31 DIAGNOSIS — E1169 Type 2 diabetes mellitus with other specified complication: Secondary | ICD-10-CM

## 2014-05-31 DIAGNOSIS — E11628 Type 2 diabetes mellitus with other skin complications: Secondary | ICD-10-CM

## 2014-05-31 DIAGNOSIS — L988 Other specified disorders of the skin and subcutaneous tissue: Secondary | ICD-10-CM

## 2014-05-31 DIAGNOSIS — E119 Type 2 diabetes mellitus without complications: Secondary | ICD-10-CM

## 2014-05-31 LAB — POCT GLYCOSYLATED HEMOGLOBIN (HGB A1C): Hemoglobin A1C: 8.1

## 2014-05-31 NOTE — Progress Notes (Addendum)
Patient ID: [name] LaurenceJerry Wentz MRN: [id number], DOB: 05/13/1951, 63 y.o. Date of Encounter: 05/31/2014, 9:51 AM  Primary Physician: Elvina SidleLAUENSTEIN,Kimberlie Csaszar, MD  Chief Complaint: Diabetes follow up  HPI: 63 y.o. year old male with history below presents for follow up of diabetes mellitus. Doing well. No issues or complaints. Taking medications daily without adverse effects. No polydipsia, polyphagia, polyuria, or nocturia.   Blood sugars at home: 127 Diet consists of: Exercising regularly. Last A1C:   Eye MD: DDS: Influenza vaccine: Pneumococcal vaccine: Last CPE:  Pt reports he had been falling and hit his head and got a referral to a physical therapist by Dr. Alwyn [place]Hopper in [place]. Pt reports physical therapy has helped him with leg flexibility and strengthening his legs. Pt reports not falling since he started physical therapy.  Pt reports going to an opthalmologist in June 2015. Pt reports the opthalmologist wants him to wear his glasses all the time to prevent foreign bodies from entering his right eye. Pt reports he cannot feel anything down into his ankles. Pt reports diabetic skin rashes on his lower extremities bilaterally. Pt reports he has no new medical issues.   Pt reports he just returned from [place] visiting his daughter and granddaughter. Pt reports he is doing okay financially. Pt reports he received some monetary gain from a lawsuit.  Past Medical History  Diagnosis Date  . Diabetes mellitus without complication   . Hypertension   . Hyperlipidemia   . Myocardial infarction   . Stroke      Home Meds: Prior to Admission medications   Medication Sig Start Date End Date Taking? Authorizing Provider  aspirin 81 MG tablet Take 81 mg by mouth daily.   Yes Historical Provider, MD  Fenofibric Acid 105 MG TABS Take 1 tablet by mouth daily. 03/05/14  Yes Elvina SidleKurt Jimeka Balan, MD  gabapentin (NEURONTIN) 300 MG capsule TAKE ONE CAPSULE 3 TIMES A DAY   Yes Sarah L Weber, PA-C  glucose blood  (TRUETRACK TEST) test strip Use as instructed. Dx code 250.00 03/05/14  Yes Elvina SidleKurt Timiyah Romito, MD  insulin glargine (LANTUS) 100 UNIT/ML injection Inject 0.4 mLs (40 Units total) into the skin daily. 03/05/14  Yes Elvina SidleKurt Fleeta Kunde, MD  insulin regular (HUMULIN R) 100 units/mL injection As directed by PCP. Needs office visit with labs. 03/05/14  Yes Elvina SidleKurt Lima Chillemi, MD  ketoconazole (NIZORAL) 2 % shampoo Apply topically 2 (two) times a week. 11/20/12  Yes Elvina SidleKurt Brave Dack, MD  lisinopril-hydrochlorothiazide (PRINZIDE,ZESTORETIC) 20-12.5 MG per tablet Take 1 tablet by mouth daily. 03/12/14  Yes Eleanore Delia ChimesE Egan, PA-C  metoprolol tartrate (LOPRESSOR) 25 MG tablet Take 1 tablet (25 mg total) by mouth 2 (two) times daily. 03/05/14  Yes Elvina SidleKurt Arnetra Terris, MD  Multiple Vitamin (MULTIVITAMIN) tablet Take 1 tablet by mouth daily.   Yes Historical Provider, MD  mupirocin ointment (BACTROBAN) 2 % Apply 1 application topically 3 (three) times daily. 01/24/14  Yes Peyton Najjaravid H Hopper, MD  OVER THE COUNTER MEDICATION Aleve 2 tablets taking for pain   Yes Historical Provider, MD  rosuvastatin (CRESTOR) 10 MG tablet Take 1 tablet (10 mg total) by mouth daily. 03/05/14  Yes Elvina SidleKurt Bilaal Leib, MD  traMADol (ULTRAM) 50 MG tablet TAKE 1 TABLET BY MOUTH AT BEDTIME MAY REPEAT EVERY 6 HOURS   Yes Elvina SidleKurt Briggitte Boline, MD    Allergies: No Known Allergies  History   Social History  . Marital Status: Single    Spouse Name: N/A    Number of Children: N/A  . Years of Education:  N/A   Occupational History  . Not on file.   Social History Main Topics  . Smoking status: Never Smoker   . Smokeless tobacco: Not on file  . Alcohol Use: No  . Drug Use: No  . Sexual Activity: Not on file   Other Topics Concern  . Not on file   Social History Narrative  . No narrative on file     Lab Results  Component Value Date   HGBA1C 7.6 03/05/2014    Review of Systems: Constitutional: negative for chills, fever, night sweats, weight changes, or  fatigue  HEENT: negative for hearing loss, congestion, rhinorrhea, or epistaxis. Positive for vision changes (unable to see out of left eye). Cardiovascular: negative for chest pain, palpitations, diaphoresis, DOE, orthopnea, or edema Respiratory: negative for hemoptysis, wheezing, shortness of breath, dyspnea, or cough Abdominal: negative for abdominal pain, nausea, vomiting, diarrhea, or constipation Dermatological: negative for erythema, or wounds. Positive for rash (legs bilaterally due to diabetes) Musculoskeletal: Positive for numbness (lower extremities bilaterally). Neurologic: negative for headache, dizziness, or syncope Renal:  Negative for polyuria, polydipsia, or dysuria All other systems reviewed and are otherwise negative with the exception to those above and in the HPI.   Physical Exam: Blood pressure 130/78, pulse 59, temperature 98.1 F (36.7 C), temperature source Oral, resp. rate 16, height 5\' 8"  (1.727 m), weight 227 lb 12.8 oz (103.329 kg), SpO2 99.00%., Body mass index is 34.64 kg/(m^2). Wt Readings from Last 3 Encounters:  05/31/14 227 lb 12.8 oz (103.329 kg)  03/05/14 212 lb 12.8 oz (96.525 kg)  02/16/14 219 lb (99.338 kg)   BP Readings from Last 3 Encounters:  05/31/14 130/78  03/05/14 160/70  02/16/14 114/50   General: Well developed, well nourished, in no acute distress. Head: Normocephalic, atraumatic, eyes without discharge, sclera non-icteric, nares are without discharge. Bilateral auditory canals clear, TM's are without perforation, pearly grey and translucent with reflective cone of light bilaterally. Oral cavity moist, posterior pharynx without exudate, erythema, peritonsillar abscess, or post nasal drip.  Neck: Supple. No thyromegaly. Full ROM. No lymphadenopathy. Lungs: Clear bilaterally to auscultation without wheezes, rales, or rhonchi. Breathing is unlabored. Heart: RRR with S1 S2. No murmurs, rubs, or gallops appreciated. Abdomen: Soft, non-tender,  non-distended with normoactive bowel sounds. No hepatosplenomegaly. No rebound/guarding. No obvious abdominal masses. Msk:  Strength and tone normal for age. Extremities/Skin: Warm and dry. No clubbing or cyanosis. No edema. No rashes, wounds, or suspicious lesions. Monofilament exam decreased symmetrically to ankles. Pink plaques on anterior shins consistent with diabetic rash. Neuro: Alert and oriented X 3. Moves all extremities spontaneously. Gait is normal. CNII-XII grossly in tact. Psych:  Responds to questions appropriately with a normal affect.   Results for orders placed in visit on 05/31/14  POCT GLYCOSYLATED HEMOGLOBIN (HGB A1C)      Result Value Ref Range   Hemoglobin A1C 8.1      ASSESSMENT AND PLAN:  63 y.o. year old male with diabetes. Type II or unspecified type diabetes mellitus without mention of complication, not stated as uncontrolled - Plan: HM Diabetes Foot Exam, POCT glycosylated hemoglobin (Hb A1C)  This is actually the best I've seen Kjuan look in some time.  His gait is stable, eye contact good, smiles easily and seems more confident. Patient refuses colonoscopy but will consider it in future. Denies any chest pains at this time Follow up 3 months  Signed, [name], MD 05/31/2014 9:58 AM

## 2014-06-07 ENCOUNTER — Ambulatory Visit: Payer: Medicare Other | Admitting: Family Medicine

## 2014-06-19 DIAGNOSIS — E1165 Type 2 diabetes mellitus with hyperglycemia: Principal | ICD-10-CM

## 2014-06-19 DIAGNOSIS — IMO0001 Reserved for inherently not codable concepts without codable children: Secondary | ICD-10-CM

## 2014-07-01 ENCOUNTER — Other Ambulatory Visit: Payer: Self-pay | Admitting: Family Medicine

## 2014-07-01 NOTE — Telephone Encounter (Signed)
Faxed

## 2014-07-15 ENCOUNTER — Other Ambulatory Visit: Payer: Self-pay | Admitting: Physician Assistant

## 2014-08-02 ENCOUNTER — Other Ambulatory Visit: Payer: Self-pay | Admitting: Family Medicine

## 2014-08-02 NOTE — Telephone Encounter (Signed)
Ok to refill tramadol x 2 months

## 2014-08-06 ENCOUNTER — Other Ambulatory Visit: Payer: Self-pay | Admitting: *Deleted

## 2014-08-06 NOTE — Telephone Encounter (Signed)
Called in [facility]

## 2014-09-06 ENCOUNTER — Ambulatory Visit: Payer: Medicare Other | Admitting: Family Medicine

## 2014-09-13 ENCOUNTER — Other Ambulatory Visit: Payer: Self-pay | Admitting: Radiology

## 2014-09-13 ENCOUNTER — Encounter: Payer: Self-pay | Admitting: Family Medicine

## 2014-09-13 ENCOUNTER — Ambulatory Visit (INDEPENDENT_AMBULATORY_CARE_PROVIDER_SITE_OTHER): Payer: Medicare Other | Admitting: Family Medicine

## 2014-09-13 VITALS — BP 137/70 | HR 62 | Temp 98.5°F | Resp 16 | Ht 68.0 in | Wt 236.0 lb

## 2014-09-13 DIAGNOSIS — I10 Essential (primary) hypertension: Secondary | ICD-10-CM

## 2014-09-13 DIAGNOSIS — Z23 Encounter for immunization: Secondary | ICD-10-CM

## 2014-09-13 DIAGNOSIS — E1142 Type 2 diabetes mellitus with diabetic polyneuropathy: Secondary | ICD-10-CM

## 2014-09-13 LAB — POCT GLYCOSYLATED HEMOGLOBIN (HGB A1C): Hemoglobin A1C: 9.1

## 2014-09-13 MED ORDER — TRAMADOL HCL 50 MG PO TABS: ORAL_TABLET | ORAL | Status: DC

## 2014-09-13 MED ORDER — INSULIN REGULAR HUMAN 100 UNIT/ML IJ SOLN: INTRAMUSCULAR | Status: DC

## 2014-09-13 MED ORDER — METOPROLOL TARTRATE 25 MG PO TABS
25.0000 mg | ORAL_TABLET | Freq: Two times a day (BID) | ORAL | Status: DC
Start: 2014-09-13 — End: 2015-04-04

## 2014-09-13 MED ORDER — LISINOPRIL-HYDROCHLOROTHIAZIDE 20-12.5 MG PO TABS: 1.0000 | ORAL_TABLET | Freq: Every day | ORAL | Status: DC

## 2014-09-13 NOTE — Progress Notes (Signed)
   Subjective:    Patient ID: [name] [name], male    DOB: 11/17/1950, 63 y.o.   MRN: [id number] This chart was scribed for Elvina SidleKurt Shalee Paolo, MD by Littie Deedsichard Sun, Medical Scribe. This patient was seen in Room 24 and the patient's care was started at 9:59 AM.   HPI HPI Comments: [name] LaurenceJerry Molenda is a 63 y.o. male with a hx of DM and diabetic peripheral neuropathy who presents to the [facility] for a follow-up for diabetes. Patient has been checking his sugar at home; his CBG was 131 this morning before breakfast. He has joined a gym, doing weight machines and the bike. He denies any weakness. Patient has been seeing an eye doctor and has been taking Ocuvite 50+. He has not had a colonoscopy; he declines colonoscopies due to concern for the colonoscopy preparation fluid causing illness. Patient does not see a dentist regularly and denies any dental problems.  Patient refuses flu and pneumonia vaccines.  Review of Systems  Constitutional: Negative for fever.  HENT: Negative for dental problem.   Eyes: Negative for discharge.  Respiratory: Negative for cough.   Cardiovascular: Negative for chest pain.  Gastrointestinal: Negative for diarrhea.  Genitourinary: Negative for hematuria.  Musculoskeletal: Negative for back pain.  Skin: Negative for rash.  Neurological: Negative for weakness.  Psychiatric/Behavioral: Negative for confusion.       Objective:   Physical Exam CONSTITUTIONAL: Well developed/well nourished HEAD: Normocephalic/atraumatic EYES: EOM/PERRL. Blind in left eye. ENMT: Mucous membranes moist NECK: supple no meningeal signs SPINE: entire spine nontender CV: S1/S2 noted, no murmurs/rubs/gallops noted. Regular rate and rhythm. LUNGS: Lungs are clear to auscultation bilaterally, no apparent distress ABDOMEN: soft, nontender, no rebound or guarding GU: no cva tenderness NEURO: Pt is awake/alert, moves all extremitiesx4 EXTREMITIES: pulses normal, full ROM. Feet  are numb. SKIN: warm, color normal PSYCH: no abnormalities of mood noted  I was unable to talk patient into colonoscopy or immunizations  Results for orders placed or performed in visit on 09/13/14  POCT glycosylated hemoglobin (Hb A1C)  Result Value Ref Range   Hemoglobin A1C 9.1     Assessment & Plan:   Need for prophylactic vaccination and inoculation against influenza - Plan: Flu Vaccine QUAD 36+ mos IM  Type 2 diabetes mellitus with diabetic polyneuropathy - Plan: traMADol (ULTRAM) 50 MG tablet, insulin regular (HUMULIN R) 100 units/mL injection, POCT glycosylated hemoglobin (Hb A1C)  Essential hypertension - Plan: metoprolol tartrate (LOPRESSOR) 25 MG tablet  Signed, Elvina SidleKurt Nadeen Shipman, MD

## 2014-09-13 NOTE — Patient Instructions (Addendum)
Your hemoglobin A1c is still high.  I want you to pay particular attention to blood sugars 2 hours after meals. Many people have good fasting blood sugars but need additional mealtime insulin to keep that down.  I would like to see you in 3 months

## 2014-10-19 ENCOUNTER — Other Ambulatory Visit: Payer: Self-pay | Admitting: Family Medicine

## 2014-10-23 NOTE — Telephone Encounter (Signed)
Faxed

## 2014-11-27 ENCOUNTER — Other Ambulatory Visit: Payer: Self-pay | Admitting: Family Medicine

## 2014-12-13 ENCOUNTER — Ambulatory Visit (INDEPENDENT_AMBULATORY_CARE_PROVIDER_SITE_OTHER): Payer: Medicare Other | Admitting: Family Medicine

## 2014-12-13 ENCOUNTER — Encounter: Payer: Self-pay | Admitting: Family Medicine

## 2014-12-13 VITALS — BP 140/72 | HR 71 | Temp 98.1°F | Resp 16 | Ht 69.0 in | Wt 238.8 lb

## 2014-12-13 DIAGNOSIS — E785 Hyperlipidemia, unspecified: Secondary | ICD-10-CM

## 2014-12-13 DIAGNOSIS — I10 Essential (primary) hypertension: Secondary | ICD-10-CM

## 2014-12-13 DIAGNOSIS — R7989 Other specified abnormal findings of blood chemistry: Secondary | ICD-10-CM

## 2014-12-13 DIAGNOSIS — E1142 Type 2 diabetes mellitus with diabetic polyneuropathy: Secondary | ICD-10-CM

## 2014-12-13 LAB — MICROALBUMIN, URINE: Microalb, Ur: 200.5 mg/dL — ABNORMAL HIGH (ref <2.0)

## 2014-12-13 LAB — LIPID PANEL
Cholesterol: 137 mg/dL (ref 0–200)
HDL: 28 mg/dL — ABNORMAL LOW (ref >39)
LDL Cholesterol: 50 mg/dL (ref 0–99)
Total CHOL/HDL Ratio: 4.9 Ratio
Triglycerides: 294 mg/dL — ABNORMAL HIGH (ref <150)
VLDL: 59 mg/dL — ABNORMAL HIGH (ref 0–40)

## 2014-12-13 LAB — COMPLETE METABOLIC PANEL WITH GFR
ALT: 31 U/L (ref 0–53)
AST: 39 U/L — ABNORMAL HIGH (ref 0–37)
Albumin: 3.8 g/dL (ref 3.5–5.2)
Alkaline Phosphatase: 67 U/L (ref 39–117)
BUN: 38 mg/dL — ABNORMAL HIGH (ref 6–23)
CO2: 27 mEq/L (ref 19–32)
Calcium: 9.1 mg/dL (ref 8.4–10.5)
Chloride: 99 mEq/L (ref 96–112)
Creat: 2.06 mg/dL — ABNORMAL HIGH (ref 0.50–1.35)
GFR, Est African American: 39 mL/min — ABNORMAL LOW
GFR, Est Non African American: 34 mL/min — ABNORMAL LOW
Glucose, Bld: 305 mg/dL — ABNORMAL HIGH (ref 70–99)
Potassium: 5.1 mEq/L (ref 3.5–5.3)
Sodium: 137 mEq/L (ref 135–145)
Total Bilirubin: 0.5 mg/dL (ref 0.2–1.2)
Total Protein: 7.1 g/dL (ref 6.0–8.3)

## 2014-12-13 LAB — POCT GLYCOSYLATED HEMOGLOBIN (HGB A1C): Hemoglobin A1C: 8.4

## 2014-12-13 NOTE — Addendum Note (Signed)
Addended by: Elvina SidleLAUENSTEIN, Benjamyn Hestand on: 12/13/2014 04:38 PM   Modules accepted: Orders

## 2014-12-13 NOTE — Progress Notes (Addendum)
This chart was scribed for [name], MD by Tonye Royalty, ED Scribe.   Patient ID: [name] [name] MRN: [id number], DOB: July 05, 1951, 64 y.o. Date of Encounter: 12/13/2014, 9:34 AM  Primary Physician: [name], MD  Chief Complaint: diabetes follow up  HPI: 64 y.o. year old male with history below, including HTN, HLD, and DM, presents for followup. He states he has been generally well and denies any new problems. He states he does not need refills on any medications.  He denies any sores on his feet. He states they are numb most of the time but feels them when neuropathy flares up. He had pneumovax in 2014. He states he goes to the gym 3x a week for 45 minutes and uses the bicycle and weights. He states he gets his eyes checked once a year by an eye group in [place]. He denies any new vision changes. Last lipid panel was 03/05/2014, last microalbumin was 11/23/2013.  He is on disability and does not work. He used to be a Runner, broadcasting/film/video and drove a truck. He used to live in [place].   Marland Kitchen Past Medical History  Diagnosis Date   Diabetes mellitus without complication    Hypertension    Hyperlipidemia    Myocardial infarction    Stroke      Home Meds: Prior to Admission medications   Medication Sig Start Date End Date Taking? Authorizing Provider  aspirin 81 MG tablet Take 81 mg by mouth daily.   Yes Historical Provider, MD  Fenofibric Acid 105 MG TABS Take 1 tablet by mouth daily. 03/05/14  Yes [name], MD  gabapentin (NEURONTIN) 300 MG capsule TAKE ONE CAPSULE 3 TIMES A DAY 10/22/14  Yes [name], MD  glucose blood (TRUETRACK TEST) test strip Use as instructed. Dx code 250.00 03/05/14  Yes [name], MD  insulin glargine (LANTUS) 100 UNIT/ML injection Inject 0.4 mLs (40 Units total) into the skin daily. Patient taking differently: Inject 50 Units into the skin daily.  03/05/14  Yes [name], MD  insulin regular (HUMULIN R) 100 units/mL injection As directed  by PCP. Needs office visit with labs. 09/13/14  Yes [name], MD  ketoconazole (NIZORAL) 2 % shampoo Apply topically 2 (two) times a week. 11/20/12  Yes [name], MD  lisinopril-hydrochlorothiazide (PRINZIDE,ZESTORETIC) 20-12.5 MG per tablet Take 1 tablet by mouth daily. 09/13/14  Yes [name], MD  metoprolol tartrate (LOPRESSOR) 25 MG tablet Take 1 tablet (25 mg total) by mouth 2 (two) times daily. 09/13/14  Yes [name], MD  Multiple Vitamin (MULTIVITAMIN) tablet Take 1 tablet by mouth daily.   Yes Historical Provider, MD  rosuvastatin (CRESTOR) 10 MG tablet Take 1 tablet (10 mg total) by mouth daily. 03/05/14  Yes [name], MD  traMADol (ULTRAM) 50 MG tablet TAKE 1 TABLET AT BEDTIME, MAY REBEAT IN 6 HOURS 10/22/14  Yes [name], MD    Allergies: No Known Allergies  History   Social History   Marital Status: Single    Spouse Name: N/A   Number of Children: N/A   Years of Education: N/A   Occupational History   Not on file.   Social History Main Topics   Smoking status: Never Smoker    Smokeless tobacco: Not on file   Alcohol Use: No   Drug Use: No   Sexual Activity: Not on file   Other Topics Concern   Not on file   Social History Narrative     Review of  Systems: Constitutional: negative for chills, fever, night sweats, weight changes, or fatigue  HEENT: negative for vision changes, hearing loss, congestion, rhinorrhea, ST, epistaxis, or sinus pressure Cardiovascular: negative for chest pain or palpitations Respiratory: negative for hemoptysis, wheezing, shortness of breath, or cough Abdominal: negative for abdominal pain, nausea, vomiting, diarrhea, or constipation Dermatological: negative for rash Neurologic: negative for headache, dizziness, or syncope, positive numbness All other systems reviewed and are otherwise negative with the exception to those above and in the HPI.   Physical Exam: Blood pressure 140/72,  pulse 71, temperature 98.1 F (36.7 C), temperature source Oral, resp. rate 16, height 5\' 9"  (1.753 m), weight 238 lb 12.8 oz (108.319 kg), SpO2 99 %., Body mass index is 35.25 kg/(m^2). General: Well developed, well nourished, in no acute distress.  Obese with some seborrheic changes of facial skin Head: Normocephalic, atraumatic, eyes without discharge, sclera non-icteric, nares are without discharge. Blind exotrophia left eye.  Normal right fundoscopic Neck:  Supple, no adenop or thyromeg Lungs: Clear bilaterally to auscultation without wheezes, rales, or rhonchi. Breathing is unlabored. Heart: RRR with S1 S2. No murmurs, rubs, or gallops appreciated. Abdomen: Soft, non-tender, non-distended with normoactive bowel sounds. No hepatomegaly. No rebound/guarding. No obvious abdominal masses. Msk:  Strength and tone normal for age. Extremities/Skin: Warm and dry. No clubbing or cyanosis. No edema. No rashes or suspicious lesions.  Feet are numb without ulcers or callouses Neuro: Alert and oriented X 3. Moves all extremities spontaneously. Gait is normal. CNII-XII grossly in tact.  Says feet are numb Psych:  Responds to questions appropriately with a normal affect.   Labs: Results for orders placed or performed in visit on 12/13/14  POCT glycosylated hemoglobin (Hb A1C)  Result Value Ref Range   Hemoglobin A1C 8.4      ASSESSMENT AND PLAN:  64 y.o. year old male with type 2 diabetes, hypertension and hyperlipidemia.  Improved A1C.  Recheck three months   Signed, [name], MD 12/13/2014 9:31 AM

## 2015-02-04 ENCOUNTER — Other Ambulatory Visit: Payer: Self-pay

## 2015-02-04 ENCOUNTER — Telehealth: Payer: Self-pay

## 2015-02-04 MED ORDER — TRAMADOL HCL 50 MG PO TABS: ORAL_TABLET | ORAL | Status: DC

## 2015-02-04 NOTE — Telephone Encounter (Signed)
Pharm reqs RF of tramadol. Pended. 

## 2015-02-04 NOTE — Telephone Encounter (Signed)
Rx for Tramadol faxed. 

## 2015-02-05 DIAGNOSIS — N183 Chronic kidney disease, stage 3 (moderate): Secondary | ICD-10-CM | POA: Diagnosis not present

## 2015-02-05 DIAGNOSIS — I10 Essential (primary) hypertension: Secondary | ICD-10-CM | POA: Diagnosis not present

## 2015-02-07 ENCOUNTER — Other Ambulatory Visit: Payer: Self-pay | Admitting: Nephrology

## 2015-02-07 DIAGNOSIS — N183 Chronic kidney disease, stage 3 (moderate): Secondary | ICD-10-CM

## 2015-02-12 ENCOUNTER — Ambulatory Visit
Admission: RE | Admit: 2015-02-12 | Discharge: 2015-02-12 | Disposition: A | Discharge status: Home / Self Care | Payer: Medicare Other | Source: Ambulatory Visit | Attending: Nephrology | Admitting: Nephrology

## 2015-02-12 DIAGNOSIS — N189 Chronic kidney disease, unspecified: Secondary | ICD-10-CM | POA: Diagnosis not present

## 2015-02-12 DIAGNOSIS — N183 Chronic kidney disease, stage 3 (moderate): Secondary | ICD-10-CM

## 2015-02-18 ENCOUNTER — Other Ambulatory Visit: Payer: Self-pay

## 2015-02-18 DIAGNOSIS — E785 Hyperlipidemia, unspecified: Secondary | ICD-10-CM

## 2015-02-18 MED ORDER — ROSUVASTATIN CALCIUM 10 MG PO TABS: 10.0000 mg | ORAL_TABLET | Freq: Every day | ORAL | Status: DC

## 2015-02-24 ENCOUNTER — Other Ambulatory Visit: Payer: Self-pay | Admitting: Family Medicine

## 2015-02-26 ENCOUNTER — Other Ambulatory Visit: Payer: Self-pay | Admitting: Family Medicine

## 2015-03-14 ENCOUNTER — Ambulatory Visit: Payer: Medicare Other | Admitting: Family Medicine

## 2015-03-25 ENCOUNTER — Other Ambulatory Visit: Payer: Self-pay | Admitting: Family Medicine

## 2015-04-04 ENCOUNTER — Other Ambulatory Visit: Payer: Self-pay | Admitting: Family Medicine

## 2015-04-12 ENCOUNTER — Other Ambulatory Visit: Payer: Self-pay | Admitting: Family Medicine

## 2015-04-12 NOTE — Telephone Encounter (Signed)
Faxed

## 2015-04-13 ENCOUNTER — Other Ambulatory Visit: Payer: Self-pay | Admitting: Family Medicine

## 2015-04-22 ENCOUNTER — Other Ambulatory Visit: Payer: Self-pay | Admitting: Family Medicine

## 2015-04-24 ENCOUNTER — Other Ambulatory Visit: Payer: Self-pay | Admitting: Family Medicine

## 2015-04-25 ENCOUNTER — Other Ambulatory Visit: Payer: Self-pay | Admitting: Family Medicine

## 2015-04-29 ENCOUNTER — Other Ambulatory Visit: Payer: Self-pay | Admitting: Family Medicine

## 2015-05-03 ENCOUNTER — Other Ambulatory Visit: Payer: Self-pay | Admitting: Family Medicine

## 2015-05-03 NOTE — Telephone Encounter (Signed)
Faxed

## 2015-05-03 NOTE — Telephone Encounter (Signed)
Can someone advise for Dr. Elbert EwingsL?

## 2015-05-06 ENCOUNTER — Other Ambulatory Visit: Payer: Self-pay | Admitting: *Deleted

## 2015-05-06 ENCOUNTER — Telehealth: Payer: Self-pay | Admitting: *Deleted

## 2015-05-06 ENCOUNTER — Other Ambulatory Visit: Payer: Self-pay | Admitting: Family Medicine

## 2015-05-06 MED ORDER — TRAMADOL HCL 50 MG PO TABS: ORAL_TABLET | ORAL | Status: DC

## 2015-05-06 NOTE — Telephone Encounter (Signed)
Called in Tramadol rx per Dr. Katrinka BlazingSmith to [facility] [place].

## 2015-05-06 NOTE — Telephone Encounter (Signed)
Pt called stating he needs an refill on his pain medication Tramadol 50mg /60tabs/0 refills.  After speaking with Dr. Katrinka BlazingSmith she approved it and it was called in to [facility] [place].

## 2015-05-06 NOTE — Telephone Encounter (Signed)
Patient checking on the status of his tramodal.    Please call ASAP

## 2015-05-25 ENCOUNTER — Other Ambulatory Visit: Payer: Self-pay | Admitting: Family Medicine

## 2015-05-25 ENCOUNTER — Other Ambulatory Visit: Payer: Self-pay | Admitting: Physician Assistant

## 2015-05-29 ENCOUNTER — Ambulatory Visit (INDEPENDENT_AMBULATORY_CARE_PROVIDER_SITE_OTHER): Payer: Medicare Other | Admitting: Physician Assistant

## 2015-05-29 ENCOUNTER — Other Ambulatory Visit: Payer: Self-pay | Admitting: Family Medicine

## 2015-05-29 ENCOUNTER — Ambulatory Visit: Payer: Medicare Other | Admitting: Family Medicine

## 2015-05-29 VITALS — BP 154/70 | HR 66 | Temp 99.0°F | Resp 18 | Ht 68.0 in | Wt 222.0 lb

## 2015-05-29 DIAGNOSIS — R030 Elevated blood-pressure reading, without diagnosis of hypertension: Secondary | ICD-10-CM | POA: Diagnosis not present

## 2015-05-29 DIAGNOSIS — M25569 Pain in unspecified knee: Secondary | ICD-10-CM | POA: Diagnosis not present

## 2015-05-29 DIAGNOSIS — E669 Obesity, unspecified: Secondary | ICD-10-CM

## 2015-05-29 DIAGNOSIS — E1142 Type 2 diabetes mellitus with diabetic polyneuropathy: Secondary | ICD-10-CM | POA: Diagnosis not present

## 2015-05-29 DIAGNOSIS — IMO0001 Reserved for inherently not codable concepts without codable children: Secondary | ICD-10-CM

## 2015-05-29 LAB — POCT GLYCOSYLATED HEMOGLOBIN (HGB A1C): Hemoglobin A1C: 9.8

## 2015-05-29 MED ORDER — TRAMADOL HCL 50 MG PO TABS: 50.0000 mg | ORAL_TABLET | Freq: Four times a day (QID) | ORAL | Status: AC | PRN

## 2015-05-30 LAB — COMPLETE METABOLIC PANEL WITH GFR
ALBUMIN: 4.2 g/dL (ref 3.6–5.1)
ALT: 29 U/L (ref 9–46)
AST: 34 U/L (ref 10–35)
Alkaline Phosphatase: 65 U/L (ref 40–115)
BILIRUBIN TOTAL: 0.5 mg/dL (ref 0.2–1.2)
BUN: 28 mg/dL — AB (ref 7–25)
CO2: 27 meq/L (ref 20–31)
Calcium: 9.9 mg/dL (ref 8.6–10.3)
Chloride: 99 mEq/L (ref 98–110)
Creat: 1.64 mg/dL — ABNORMAL HIGH (ref 0.70–1.25)
GFR, EST AFRICAN AMERICAN: 51 mL/min — AB (ref >=60)
GFR, EST NON AFRICAN AMERICAN: 44 mL/min — AB (ref >=60)
Glucose, Bld: 164 mg/dL — ABNORMAL HIGH (ref 65–99)
Potassium: 5 mEq/L (ref 3.5–5.3)
Sodium: 136 mEq/L (ref 135–146)
Total Protein: 7.5 g/dL (ref 6.1–8.1)

## 2015-05-30 MED ORDER — METFORMIN HCL 500 MG PO TABS: ORAL_TABLET | ORAL | Status: DC

## 2015-05-30 NOTE — Progress Notes (Signed)
05/30/2015 at 7:34 PM  [name] [name] / DOB: 1951-03-06 / MRN: [id number]  The patient has Type II or unspecified type diabetes mellitus without mention of complication, uncontrolled; Obesity, Class II, BMI 35-39.9, with comorbidity; HTN (hypertension); and Diabetic peripheral neuropathy associated with type 2 diabetes mellitus on his problem list.  SUBJECTIVE  Chief complaint: Medication Refills and Paper Work  He would like refills of his diabetes medication today and he is taking insulin only and uses 35 units of Humulin in the morning and 40 of Latus at night.  He is not on Metformin and it is not clear why. He reports that after his MI in 2007 he was taken off of Metformin and started on insulin and since that time Metformin has never been mentioned again. He sees the eye doctor yearly.  He can not feel his feet, aside from pain, and does not check his feet on a daily basis.      He would like a refill of his Tramadol today and reports he has been getting half of his normal supply at 60 pills per month.  He takes this for peripheral neuropathy and reports "pain so bad I can't walk" when he does not have this and it is both frustrating and depressing for him.      He  has a past medical history of Diabetes mellitus without complication; Hypertension; Hyperlipidemia; Myocardial infarction; and Stroke.     [name] [name] has No Known Allergies. He  reports that he has never smoked. He does not have any smokeless tobacco history on file. He reports that he does not drink alcohol or use illicit drugs. He  has no sexual activity history on file. The patient  has no past surgical history on file.  His family history includes Diabetes in his brother and mother; Heart disease in his father; Kidney disease in his mother.  Review of Systems  Constitutional: Negative for fever and chills.  Respiratory: Negative for cough.   Gastrointestinal: Negative for nausea.  Genitourinary: Negative for dysuria,  urgency and frequency.  Skin: Negative for rash.  Neurological: Negative for dizziness and headaches.    OBJECTIVE  His  height is '5\' 8"'  (1.727 m) and weight is 222 lb (100.699 kg). His oral temperature is 99 F (37.2 C). His blood pressure is 154/70 and his pulse is 66. His respiration is 18 and oxygen saturation is 98%.  The patient's body mass index is 33.76 kg/(m^2).  Physical Exam  Vitals reviewed. Constitutional: He is oriented to person, place, and time. He appears well-developed and well-nourished.  Cardiovascular: Normal rate.   Respiratory: Effort normal.  GI: Soft.  Musculoskeletal: Normal range of motion.  Neurological: He is alert and oriented to person, place, and time.  Skin: Skin is warm and dry.  Psychiatric: He has a normal mood and affect.    No results found for this or any previous visit (from the past 24 hour(s)). Results for [name] [name] (MRN [id number]) as of 05/30/2015 08:20  Ref. Range 09/13/2014 10:25 12/13/2014 10:02 05/29/2015 16:18  Hemoglobin A1C Unknown 9.1 8.4 9.8   Lab Results  Component Value Date   CREATININE 1.64* 05/29/2015   CREATININE 2.06* 12/13/2014   CREATININE 1.52* 03/05/2014     Recent Results (from the past 2160 hour(s))  COMPLETE METABOLIC PANEL WITH GFR     Status: Abnormal   Collection Time: 05/29/15  4:06 PM  Result Value Ref Range   Sodium 136 135 - 146 mEq/L  Potassium 5.0 3.5 - 5.3 mEq/L   Chloride 99 98 - 110 mEq/L   CO2 27 20 - 31 mEq/L   Glucose, Bld 164 (H) 65 - 99 mg/dL   BUN 28 (H) 7 - 25 mg/dL   Creat 1.64 (H) 0.70 - 1.25 mg/dL   Total Bilirubin 0.5 0.2 - 1.2 mg/dL   Alkaline Phosphatase 65 40 - 115 U/L   AST 34 10 - 35 U/L   ALT 29 9 - 46 U/L   Total Protein 7.5 6.1 - 8.1 g/dL   Albumin 4.2 3.6 - 5.1 g/dL   Calcium 9.9 8.6 - 10.3 mg/dL   GFR, Est African American 51 (L) >=60 mL/min   GFR, Est Non African American 44 (L) >=60 mL/min    Comment:   The estimated GFR is a calculation valid for adults  (>=63 years old) that uses the CKD-EPI algorithm to adjust for age and sex. It is   not to be used for children, pregnant women, hospitalized patients,    patients on dialysis, or with rapidly changing kidney function. According to the NKDEP, eGFR >89 is normal, 60-89 shows mild impairment, 30-59 shows moderate impairment, 15-29 shows severe impairment and <15 is ESRD.     Footnotes:  (1) ** Please note change in unit of measure and reference range(s). **     POCT glycosylated hemoglobin (Hb A1C)     Status: None   Collection Time: 05/29/15  4:18 PM  Result Value Ref Range   Hemoglobin A1C 9.8    Patient without any sensation in his feet bilaterally with monofilament.    ASSESSMENT & PLAN  [name] [name] was seen today for medication refills and paper work.  Diagnoses and all orders for this visit:  Type 2 diabetes mellitus with diabetic polyneuropathy: Starting metformin back as this is recommended and I can find no contraindication.  Will send to podiatry given   Orders: -     POCT glycosylated hemoglobin (Hb A1C) -     COMPLETE METABOLIC PANEL WITH GFR -     metFORMIN (GLUCOPHAGE) 500 MG tablet; Take 1 at breakfast for 7 days, then 1 with breakfast and dinner at days 7-14. From 21 days on take 2 with breakfast and dinner.  Pain in joint, lower leg, unspecified laterality Orders: -     traMADol (ULTRAM) 50 MG tablet; Take 1 tablet (50 mg total) by mouth every 6 (six) hours as needed for severe pain. Do not take more than prescribed.  Obesity: Will address this problem at his next follow up.   Elevated BP: Will titrated medication at his next follow up in three months.     The patient was advised to call or come back to clinic if he does not see an improvement in symptoms, or worsens with the above plan.   [name] [name], MHS, PA-C [facility] 05/30/2015 7:34 PM

## 2015-05-31 ENCOUNTER — Ambulatory Visit: Payer: Medicare Other | Admitting: Family Medicine

## 2015-06-03 ENCOUNTER — Other Ambulatory Visit: Payer: Self-pay | Admitting: Physician Assistant

## 2015-06-03 ENCOUNTER — Telehealth: Payer: Self-pay

## 2015-06-03 ENCOUNTER — Ambulatory Visit: Payer: Medicare Other | Admitting: Podiatry

## 2015-06-03 DIAGNOSIS — E1142 Type 2 diabetes mellitus with diabetic polyneuropathy: Secondary | ICD-10-CM

## 2015-06-03 MED ORDER — METFORMIN HCL 500 MG PO TABS: ORAL_TABLET | ORAL | Status: AC

## 2015-06-03 NOTE — Telephone Encounter (Signed)
Pharm faxed req for clarification of dose of metformin between days 14-21. That week was skipped in sig. Casimiro Needle please advise and/or send in new Rx.

## 2015-06-09 DIAGNOSIS — I2510 Atherosclerotic heart disease of native coronary artery without angina pectoris: Secondary | ICD-10-CM | POA: Diagnosis not present

## 2015-06-09 DIAGNOSIS — N179 Acute kidney failure, unspecified: Secondary | ICD-10-CM | POA: Diagnosis not present

## 2015-06-09 DIAGNOSIS — I252 Old myocardial infarction: Secondary | ICD-10-CM | POA: Diagnosis not present

## 2015-06-09 DIAGNOSIS — R748 Abnormal levels of other serum enzymes: Secondary | ICD-10-CM | POA: Diagnosis not present

## 2015-06-09 DIAGNOSIS — N184 Chronic kidney disease, stage 4 (severe): Secondary | ICD-10-CM | POA: Diagnosis not present

## 2015-06-09 DIAGNOSIS — E1129 Type 2 diabetes mellitus with other diabetic kidney complication: Secondary | ICD-10-CM | POA: Diagnosis not present

## 2015-06-09 DIAGNOSIS — L97509 Non-pressure chronic ulcer of other part of unspecified foot with unspecified severity: Secondary | ICD-10-CM | POA: Diagnosis not present

## 2015-06-09 DIAGNOSIS — E1165 Type 2 diabetes mellitus with hyperglycemia: Secondary | ICD-10-CM | POA: Diagnosis not present

## 2015-06-09 DIAGNOSIS — Z6841 Body Mass Index (BMI) 40.0 and over, adult: Secondary | ICD-10-CM | POA: Diagnosis not present

## 2015-06-09 DIAGNOSIS — Z87891 Personal history of nicotine dependence: Secondary | ICD-10-CM | POA: Diagnosis not present

## 2015-06-09 DIAGNOSIS — E1142 Type 2 diabetes mellitus with diabetic polyneuropathy: Secondary | ICD-10-CM | POA: Diagnosis not present

## 2015-06-09 DIAGNOSIS — I10 Essential (primary) hypertension: Secondary | ICD-10-CM | POA: Diagnosis not present

## 2015-06-09 DIAGNOSIS — E1152 Type 2 diabetes mellitus with diabetic peripheral angiopathy with gangrene: Secondary | ICD-10-CM | POA: Diagnosis not present

## 2015-06-09 DIAGNOSIS — E11621 Type 2 diabetes mellitus with foot ulcer: Secondary | ICD-10-CM | POA: Diagnosis not present

## 2015-06-09 DIAGNOSIS — I517 Cardiomegaly: Secondary | ICD-10-CM | POA: Diagnosis not present

## 2015-06-09 DIAGNOSIS — N3289 Other specified disorders of bladder: Secondary | ICD-10-CM | POA: Diagnosis not present

## 2015-06-09 DIAGNOSIS — N19 Unspecified kidney failure: Secondary | ICD-10-CM | POA: Diagnosis not present

## 2015-06-09 DIAGNOSIS — I82409 Acute embolism and thrombosis of unspecified deep veins of unspecified lower extremity: Secondary | ICD-10-CM | POA: Diagnosis not present

## 2015-06-09 DIAGNOSIS — Z794 Long term (current) use of insulin: Secondary | ICD-10-CM | POA: Diagnosis not present

## 2015-06-09 DIAGNOSIS — R7989 Other specified abnormal findings of blood chemistry: Secondary | ICD-10-CM | POA: Diagnosis not present

## 2015-06-09 DIAGNOSIS — R531 Weakness: Secondary | ICD-10-CM | POA: Diagnosis not present

## 2015-06-09 DIAGNOSIS — R9431 Abnormal electrocardiogram [ECG] [EKG]: Secondary | ICD-10-CM | POA: Diagnosis not present

## 2015-06-09 DIAGNOSIS — R001 Bradycardia, unspecified: Secondary | ICD-10-CM | POA: Diagnosis not present

## 2015-06-09 DIAGNOSIS — L97519 Non-pressure chronic ulcer of other part of right foot with unspecified severity: Secondary | ICD-10-CM | POA: Diagnosis not present

## 2015-06-09 DIAGNOSIS — I519 Heart disease, unspecified: Secondary | ICD-10-CM | POA: Diagnosis not present

## 2015-06-09 DIAGNOSIS — E1122 Type 2 diabetes mellitus with diabetic chronic kidney disease: Secondary | ICD-10-CM | POA: Diagnosis not present

## 2015-06-09 DIAGNOSIS — E119 Type 2 diabetes mellitus without complications: Secondary | ICD-10-CM | POA: Diagnosis not present

## 2015-06-09 DIAGNOSIS — E875 Hyperkalemia: Secondary | ICD-10-CM | POA: Diagnosis not present

## 2015-06-09 DIAGNOSIS — I96 Gangrene, not elsewhere classified: Secondary | ICD-10-CM | POA: Diagnosis not present

## 2015-06-09 DIAGNOSIS — D638 Anemia in other chronic diseases classified elsewhere: Secondary | ICD-10-CM | POA: Diagnosis not present

## 2015-06-09 DIAGNOSIS — I259 Chronic ischemic heart disease, unspecified: Secondary | ICD-10-CM | POA: Diagnosis not present

## 2015-06-11 ENCOUNTER — Telehealth: Payer: Self-pay | Admitting: Family Medicine

## 2015-06-11 NOTE — Telephone Encounter (Signed)
Tried calling twice phone keep hanging up so Can we please move [name] [name] appointment up to as close to 08/29/2015 as possible? If so then his December appointment can be canceled.

## 2015-06-16 DIAGNOSIS — E1140 Type 2 diabetes mellitus with diabetic neuropathy, unspecified: Secondary | ICD-10-CM | POA: Diagnosis not present

## 2015-06-16 DIAGNOSIS — I2510 Atherosclerotic heart disease of native coronary artery without angina pectoris: Secondary | ICD-10-CM | POA: Diagnosis not present

## 2015-06-16 DIAGNOSIS — Z8673 Personal history of transient ischemic attack (TIA), and cerebral infarction without residual deficits: Secondary | ICD-10-CM | POA: Diagnosis not present

## 2015-06-16 DIAGNOSIS — I10 Essential (primary) hypertension: Secondary | ICD-10-CM | POA: Diagnosis not present

## 2015-06-16 DIAGNOSIS — Z794 Long term (current) use of insulin: Secondary | ICD-10-CM | POA: Diagnosis not present

## 2015-06-17 DIAGNOSIS — Z794 Long term (current) use of insulin: Secondary | ICD-10-CM | POA: Diagnosis not present

## 2015-06-17 DIAGNOSIS — I2510 Atherosclerotic heart disease of native coronary artery without angina pectoris: Secondary | ICD-10-CM | POA: Diagnosis not present

## 2015-06-17 DIAGNOSIS — I10 Essential (primary) hypertension: Secondary | ICD-10-CM | POA: Diagnosis not present

## 2015-06-17 DIAGNOSIS — E1140 Type 2 diabetes mellitus with diabetic neuropathy, unspecified: Secondary | ICD-10-CM | POA: Diagnosis not present

## 2015-06-17 DIAGNOSIS — Z8673 Personal history of transient ischemic attack (TIA), and cerebral infarction without residual deficits: Secondary | ICD-10-CM | POA: Diagnosis not present

## 2015-06-18 DIAGNOSIS — E1140 Type 2 diabetes mellitus with diabetic neuropathy, unspecified: Secondary | ICD-10-CM | POA: Diagnosis not present

## 2015-06-18 DIAGNOSIS — I2510 Atherosclerotic heart disease of native coronary artery without angina pectoris: Secondary | ICD-10-CM | POA: Diagnosis not present

## 2015-06-18 DIAGNOSIS — Z8673 Personal history of transient ischemic attack (TIA), and cerebral infarction without residual deficits: Secondary | ICD-10-CM | POA: Diagnosis not present

## 2015-06-18 DIAGNOSIS — I10 Essential (primary) hypertension: Secondary | ICD-10-CM | POA: Diagnosis not present

## 2015-06-18 DIAGNOSIS — Z794 Long term (current) use of insulin: Secondary | ICD-10-CM | POA: Diagnosis not present

## 2015-06-20 DIAGNOSIS — E1140 Type 2 diabetes mellitus with diabetic neuropathy, unspecified: Secondary | ICD-10-CM | POA: Diagnosis not present

## 2015-06-20 DIAGNOSIS — I10 Essential (primary) hypertension: Secondary | ICD-10-CM | POA: Diagnosis not present

## 2015-06-20 DIAGNOSIS — I2510 Atherosclerotic heart disease of native coronary artery without angina pectoris: Secondary | ICD-10-CM | POA: Diagnosis not present

## 2015-06-20 DIAGNOSIS — Z8673 Personal history of transient ischemic attack (TIA), and cerebral infarction without residual deficits: Secondary | ICD-10-CM | POA: Diagnosis not present

## 2015-06-20 DIAGNOSIS — Z794 Long term (current) use of insulin: Secondary | ICD-10-CM | POA: Diagnosis not present

## 2015-06-24 ENCOUNTER — Ambulatory Visit: Payer: Medicare Other | Admitting: Podiatry

## 2015-06-25 DIAGNOSIS — Z7982 Long term (current) use of aspirin: Secondary | ICD-10-CM | POA: Diagnosis not present

## 2015-06-25 DIAGNOSIS — J209 Acute bronchitis, unspecified: Secondary | ICD-10-CM | POA: Diagnosis not present

## 2015-06-25 DIAGNOSIS — R295 Transient paralysis: Secondary | ICD-10-CM | POA: Diagnosis not present

## 2015-06-25 DIAGNOSIS — J320 Chronic maxillary sinusitis: Secondary | ICD-10-CM | POA: Diagnosis not present

## 2015-06-25 DIAGNOSIS — R531 Weakness: Secondary | ICD-10-CM | POA: Diagnosis not present

## 2015-06-25 DIAGNOSIS — I252 Old myocardial infarction: Secondary | ICD-10-CM | POA: Diagnosis not present

## 2015-06-25 DIAGNOSIS — Z794 Long term (current) use of insulin: Secondary | ICD-10-CM | POA: Diagnosis not present

## 2015-06-25 DIAGNOSIS — Z87891 Personal history of nicotine dependence: Secondary | ICD-10-CM | POA: Diagnosis not present

## 2015-06-25 DIAGNOSIS — E1142 Type 2 diabetes mellitus with diabetic polyneuropathy: Secondary | ICD-10-CM | POA: Diagnosis not present

## 2015-06-25 DIAGNOSIS — E1140 Type 2 diabetes mellitus with diabetic neuropathy, unspecified: Secondary | ICD-10-CM | POA: Diagnosis not present

## 2015-06-25 DIAGNOSIS — I509 Heart failure, unspecified: Secondary | ICD-10-CM | POA: Diagnosis not present

## 2015-06-25 DIAGNOSIS — R0989 Other specified symptoms and signs involving the circulatory and respiratory systems: Secondary | ICD-10-CM | POA: Diagnosis not present

## 2015-06-25 DIAGNOSIS — Z8673 Personal history of transient ischemic attack (TIA), and cerebral infarction without residual deficits: Secondary | ICD-10-CM | POA: Diagnosis not present

## 2015-06-25 DIAGNOSIS — I2510 Atherosclerotic heart disease of native coronary artery without angina pectoris: Secondary | ICD-10-CM | POA: Diagnosis not present

## 2015-06-25 DIAGNOSIS — Z79899 Other long term (current) drug therapy: Secondary | ICD-10-CM | POA: Diagnosis not present

## 2015-06-25 DIAGNOSIS — I10 Essential (primary) hypertension: Secondary | ICD-10-CM | POA: Diagnosis not present

## 2015-06-26 DIAGNOSIS — Z87891 Personal history of nicotine dependence: Secondary | ICD-10-CM | POA: Diagnosis not present

## 2015-06-26 DIAGNOSIS — I2510 Atherosclerotic heart disease of native coronary artery without angina pectoris: Secondary | ICD-10-CM | POA: Diagnosis not present

## 2015-06-26 DIAGNOSIS — Z794 Long term (current) use of insulin: Secondary | ICD-10-CM | POA: Diagnosis not present

## 2015-06-26 DIAGNOSIS — E119 Type 2 diabetes mellitus without complications: Secondary | ICD-10-CM | POA: Diagnosis not present

## 2015-06-26 DIAGNOSIS — I509 Heart failure, unspecified: Secondary | ICD-10-CM | POA: Diagnosis not present

## 2015-06-26 DIAGNOSIS — J209 Acute bronchitis, unspecified: Secondary | ICD-10-CM | POA: Diagnosis not present

## 2015-06-26 DIAGNOSIS — N179 Acute kidney failure, unspecified: Secondary | ICD-10-CM | POA: Diagnosis not present

## 2015-06-26 DIAGNOSIS — R4182 Altered mental status, unspecified: Secondary | ICD-10-CM | POA: Diagnosis not present

## 2015-06-26 DIAGNOSIS — I10 Essential (primary) hypertension: Secondary | ICD-10-CM | POA: Diagnosis not present

## 2015-06-26 DIAGNOSIS — J320 Chronic maxillary sinusitis: Secondary | ICD-10-CM | POA: Diagnosis not present

## 2015-06-26 DIAGNOSIS — M6250 Muscle wasting and atrophy, not elsewhere classified, unspecified site: Secondary | ICD-10-CM | POA: Diagnosis not present

## 2015-06-26 DIAGNOSIS — G629 Polyneuropathy, unspecified: Secondary | ICD-10-CM | POA: Diagnosis not present

## 2015-06-26 DIAGNOSIS — R531 Weakness: Secondary | ICD-10-CM | POA: Diagnosis not present

## 2015-06-26 DIAGNOSIS — E1142 Type 2 diabetes mellitus with diabetic polyneuropathy: Secondary | ICD-10-CM | POA: Diagnosis not present

## 2015-06-26 DIAGNOSIS — Z7982 Long term (current) use of aspirin: Secondary | ICD-10-CM | POA: Diagnosis not present

## 2015-06-26 DIAGNOSIS — M6281 Muscle weakness (generalized): Secondary | ICD-10-CM | POA: Diagnosis not present

## 2015-06-26 DIAGNOSIS — R262 Difficulty in walking, not elsewhere classified: Secondary | ICD-10-CM | POA: Diagnosis not present

## 2015-06-26 DIAGNOSIS — I252 Old myocardial infarction: Secondary | ICD-10-CM | POA: Diagnosis not present

## 2015-06-26 DIAGNOSIS — Z79899 Other long term (current) drug therapy: Secondary | ICD-10-CM | POA: Diagnosis not present

## 2015-07-02 DIAGNOSIS — I10 Essential (primary) hypertension: Secondary | ICD-10-CM | POA: Diagnosis not present

## 2015-07-02 DIAGNOSIS — I2510 Atherosclerotic heart disease of native coronary artery without angina pectoris: Secondary | ICD-10-CM | POA: Diagnosis not present

## 2015-07-02 DIAGNOSIS — R4182 Altered mental status, unspecified: Secondary | ICD-10-CM | POA: Diagnosis not present

## 2015-07-02 DIAGNOSIS — E119 Type 2 diabetes mellitus without complications: Secondary | ICD-10-CM | POA: Diagnosis not present

## 2015-07-04 ENCOUNTER — Encounter: Payer: Self-pay | Admitting: Family Medicine

## 2015-07-09 ENCOUNTER — Other Ambulatory Visit: Payer: Self-pay | Admitting: Family Medicine

## 2015-07-24 ENCOUNTER — Ambulatory Visit: Payer: Self-pay

## 2015-08-27 DIAGNOSIS — L98493 Non-pressure chronic ulcer of skin of other sites with necrosis of muscle: Secondary | ICD-10-CM | POA: Diagnosis not present

## 2015-08-27 DIAGNOSIS — L02611 Cutaneous abscess of right foot: Secondary | ICD-10-CM | POA: Diagnosis not present

## 2015-08-27 DIAGNOSIS — G629 Polyneuropathy, unspecified: Secondary | ICD-10-CM | POA: Diagnosis not present

## 2015-08-27 DIAGNOSIS — L03116 Cellulitis of left lower limb: Secondary | ICD-10-CM | POA: Diagnosis not present

## 2015-08-27 DIAGNOSIS — Z79899 Other long term (current) drug therapy: Secondary | ICD-10-CM | POA: Diagnosis not present

## 2015-08-27 DIAGNOSIS — L89614 Pressure ulcer of right heel, stage 4: Secondary | ICD-10-CM | POA: Diagnosis not present

## 2015-08-27 DIAGNOSIS — E1165 Type 2 diabetes mellitus with hyperglycemia: Secondary | ICD-10-CM | POA: Diagnosis not present

## 2015-08-27 DIAGNOSIS — M6281 Muscle weakness (generalized): Secondary | ICD-10-CM | POA: Diagnosis not present

## 2015-08-27 DIAGNOSIS — Z7984 Long term (current) use of oral hypoglycemic drugs: Secondary | ICD-10-CM | POA: Diagnosis not present

## 2015-08-27 DIAGNOSIS — R809 Proteinuria, unspecified: Secondary | ICD-10-CM | POA: Diagnosis not present

## 2015-08-27 DIAGNOSIS — R2689 Other abnormalities of gait and mobility: Secondary | ICD-10-CM | POA: Diagnosis not present

## 2015-08-27 DIAGNOSIS — E11622 Type 2 diabetes mellitus with other skin ulcer: Secondary | ICD-10-CM | POA: Diagnosis not present

## 2015-08-27 DIAGNOSIS — E1142 Type 2 diabetes mellitus with diabetic polyneuropathy: Secondary | ICD-10-CM | POA: Diagnosis not present

## 2015-08-27 DIAGNOSIS — E1129 Type 2 diabetes mellitus with other diabetic kidney complication: Secondary | ICD-10-CM | POA: Diagnosis not present

## 2015-08-27 DIAGNOSIS — Z794 Long term (current) use of insulin: Secondary | ICD-10-CM | POA: Diagnosis not present

## 2015-08-27 DIAGNOSIS — L97419 Non-pressure chronic ulcer of right heel and midfoot with unspecified severity: Secondary | ICD-10-CM | POA: Diagnosis not present

## 2015-08-27 DIAGNOSIS — I252 Old myocardial infarction: Secondary | ICD-10-CM | POA: Diagnosis not present

## 2015-08-27 DIAGNOSIS — L97404 Non-pressure chronic ulcer of unspecified heel and midfoot with necrosis of bone: Secondary | ICD-10-CM | POA: Diagnosis not present

## 2015-08-27 DIAGNOSIS — L89154 Pressure ulcer of sacral region, stage 4: Secondary | ICD-10-CM | POA: Diagnosis not present

## 2015-08-27 DIAGNOSIS — Z87891 Personal history of nicotine dependence: Secondary | ICD-10-CM | POA: Diagnosis not present

## 2015-08-27 DIAGNOSIS — M868X7 Other osteomyelitis, ankle and foot: Secondary | ICD-10-CM | POA: Diagnosis not present

## 2015-08-27 DIAGNOSIS — Z451 Encounter for adjustment and management of infusion pump: Secondary | ICD-10-CM | POA: Diagnosis not present

## 2015-08-27 DIAGNOSIS — I2510 Atherosclerotic heart disease of native coronary artery without angina pectoris: Secondary | ICD-10-CM | POA: Diagnosis not present

## 2015-08-27 DIAGNOSIS — Z7982 Long term (current) use of aspirin: Secondary | ICD-10-CM | POA: Diagnosis not present

## 2015-08-27 DIAGNOSIS — I10 Essential (primary) hypertension: Secondary | ICD-10-CM | POA: Diagnosis not present

## 2015-08-27 DIAGNOSIS — R0602 Shortness of breath: Secondary | ICD-10-CM | POA: Diagnosis not present

## 2015-08-27 DIAGNOSIS — R0989 Other specified symptoms and signs involving the circulatory and respiratory systems: Secondary | ICD-10-CM | POA: Diagnosis not present

## 2015-08-27 DIAGNOSIS — R627 Adult failure to thrive: Secondary | ICD-10-CM | POA: Diagnosis not present

## 2015-08-27 DIAGNOSIS — E11621 Type 2 diabetes mellitus with foot ulcer: Secondary | ICD-10-CM | POA: Diagnosis not present

## 2015-08-27 DIAGNOSIS — R197 Diarrhea, unspecified: Secondary | ICD-10-CM | POA: Diagnosis not present

## 2015-08-27 DIAGNOSIS — D649 Anemia, unspecified: Secondary | ICD-10-CM | POA: Diagnosis not present

## 2015-08-27 DIAGNOSIS — R296 Repeated falls: Secondary | ICD-10-CM | POA: Diagnosis not present

## 2015-08-27 DIAGNOSIS — J984 Other disorders of lung: Secondary | ICD-10-CM | POA: Diagnosis not present

## 2015-08-27 DIAGNOSIS — D638 Anemia in other chronic diseases classified elsewhere: Secondary | ICD-10-CM | POA: Diagnosis not present

## 2015-08-27 DIAGNOSIS — R600 Localized edema: Secondary | ICD-10-CM | POA: Diagnosis not present

## 2015-08-27 DIAGNOSIS — L8944 Pressure ulcer of contiguous site of back, buttock and hip, stage 4: Secondary | ICD-10-CM | POA: Diagnosis not present

## 2015-08-27 DIAGNOSIS — R531 Weakness: Secondary | ICD-10-CM | POA: Diagnosis not present

## 2015-08-27 DIAGNOSIS — L97519 Non-pressure chronic ulcer of other part of right foot with unspecified severity: Secondary | ICD-10-CM | POA: Diagnosis not present

## 2015-08-27 DIAGNOSIS — M7989 Other specified soft tissue disorders: Secondary | ICD-10-CM | POA: Diagnosis not present

## 2015-08-27 DIAGNOSIS — A047 Enterocolitis due to Clostridium difficile: Secondary | ICD-10-CM | POA: Diagnosis not present

## 2015-08-27 DIAGNOSIS — L89150 Pressure ulcer of sacral region, unstageable: Secondary | ICD-10-CM | POA: Diagnosis not present

## 2015-08-27 DIAGNOSIS — Z792 Long term (current) use of antibiotics: Secondary | ICD-10-CM | POA: Diagnosis not present

## 2015-08-30 ENCOUNTER — Other Ambulatory Visit: Payer: Self-pay | Admitting: Family Medicine

## 2015-09-03 DIAGNOSIS — L89609 Pressure ulcer of unspecified heel, unspecified stage: Secondary | ICD-10-CM | POA: Diagnosis not present

## 2015-09-03 DIAGNOSIS — J90 Pleural effusion, not elsewhere classified: Secondary | ICD-10-CM | POA: Diagnosis not present

## 2015-09-03 DIAGNOSIS — Z451 Encounter for adjustment and management of infusion pump: Secondary | ICD-10-CM | POA: Diagnosis not present

## 2015-09-03 DIAGNOSIS — Z794 Long term (current) use of insulin: Secondary | ICD-10-CM | POA: Diagnosis not present

## 2015-09-03 DIAGNOSIS — M86271 Subacute osteomyelitis, right ankle and foot: Secondary | ICD-10-CM | POA: Diagnosis not present

## 2015-09-03 DIAGNOSIS — I959 Hypotension, unspecified: Secondary | ICD-10-CM | POA: Diagnosis not present

## 2015-09-03 DIAGNOSIS — L89159 Pressure ulcer of sacral region, unspecified stage: Secondary | ICD-10-CM | POA: Diagnosis not present

## 2015-09-03 DIAGNOSIS — D500 Iron deficiency anemia secondary to blood loss (chronic): Secondary | ICD-10-CM | POA: Diagnosis not present

## 2015-09-03 DIAGNOSIS — E872 Acidosis: Secondary | ICD-10-CM | POA: Diagnosis not present

## 2015-09-03 DIAGNOSIS — A4902 Methicillin resistant Staphylococcus aureus infection, unspecified site: Secondary | ICD-10-CM | POA: Diagnosis not present

## 2015-09-03 DIAGNOSIS — Z87891 Personal history of nicotine dependence: Secondary | ICD-10-CM | POA: Diagnosis not present

## 2015-09-03 DIAGNOSIS — Z79891 Long term (current) use of opiate analgesic: Secondary | ICD-10-CM | POA: Diagnosis not present

## 2015-09-03 DIAGNOSIS — E119 Type 2 diabetes mellitus without complications: Secondary | ICD-10-CM | POA: Diagnosis not present

## 2015-09-03 DIAGNOSIS — D631 Anemia in chronic kidney disease: Secondary | ICD-10-CM | POA: Diagnosis not present

## 2015-09-03 DIAGNOSIS — E09649 Drug or chemical induced diabetes mellitus with hypoglycemia without coma: Secondary | ICD-10-CM | POA: Diagnosis not present

## 2015-09-03 DIAGNOSIS — Z79899 Other long term (current) drug therapy: Secondary | ICD-10-CM | POA: Diagnosis not present

## 2015-09-03 DIAGNOSIS — N179 Acute kidney failure, unspecified: Secondary | ICD-10-CM | POA: Diagnosis not present

## 2015-09-03 DIAGNOSIS — E1122 Type 2 diabetes mellitus with diabetic chronic kidney disease: Secondary | ICD-10-CM | POA: Diagnosis not present

## 2015-09-03 DIAGNOSIS — K626 Ulcer of anus and rectum: Secondary | ICD-10-CM | POA: Diagnosis not present

## 2015-09-03 DIAGNOSIS — M6281 Muscle weakness (generalized): Secondary | ICD-10-CM | POA: Diagnosis not present

## 2015-09-03 DIAGNOSIS — I9589 Other hypotension: Secondary | ICD-10-CM | POA: Diagnosis not present

## 2015-09-03 DIAGNOSIS — I2510 Atherosclerotic heart disease of native coronary artery without angina pectoris: Secondary | ICD-10-CM | POA: Diagnosis not present

## 2015-09-03 DIAGNOSIS — L98493 Non-pressure chronic ulcer of skin of other sites with necrosis of muscle: Secondary | ICD-10-CM | POA: Diagnosis not present

## 2015-09-03 DIAGNOSIS — D638 Anemia in other chronic diseases classified elsewhere: Secondary | ICD-10-CM | POA: Diagnosis not present

## 2015-09-03 DIAGNOSIS — M868X7 Other osteomyelitis, ankle and foot: Secondary | ICD-10-CM | POA: Diagnosis not present

## 2015-09-03 DIAGNOSIS — J9600 Acute respiratory failure, unspecified whether with hypoxia or hypercapnia: Secondary | ICD-10-CM | POA: Diagnosis not present

## 2015-09-03 DIAGNOSIS — N178 Other acute kidney failure: Secondary | ICD-10-CM | POA: Diagnosis not present

## 2015-09-03 DIAGNOSIS — Z9181 History of falling: Secondary | ICD-10-CM | POA: Diagnosis not present

## 2015-09-03 DIAGNOSIS — N170 Acute kidney failure with tubular necrosis: Secondary | ICD-10-CM | POA: Diagnosis not present

## 2015-09-03 DIAGNOSIS — G629 Polyneuropathy, unspecified: Secondary | ICD-10-CM | POA: Diagnosis not present

## 2015-09-03 DIAGNOSIS — Z792 Long term (current) use of antibiotics: Secondary | ICD-10-CM | POA: Diagnosis not present

## 2015-09-03 DIAGNOSIS — E1169 Type 2 diabetes mellitus with other specified complication: Secondary | ICD-10-CM | POA: Diagnosis not present

## 2015-09-03 DIAGNOSIS — R531 Weakness: Secondary | ICD-10-CM | POA: Diagnosis not present

## 2015-09-03 DIAGNOSIS — E1165 Type 2 diabetes mellitus with hyperglycemia: Secondary | ICD-10-CM | POA: Diagnosis not present

## 2015-09-03 DIAGNOSIS — E11621 Type 2 diabetes mellitus with foot ulcer: Secondary | ICD-10-CM | POA: Diagnosis not present

## 2015-09-03 DIAGNOSIS — E1142 Type 2 diabetes mellitus with diabetic polyneuropathy: Secondary | ICD-10-CM | POA: Diagnosis not present

## 2015-09-03 DIAGNOSIS — Z7982 Long term (current) use of aspirin: Secondary | ICD-10-CM | POA: Diagnosis not present

## 2015-09-03 DIAGNOSIS — A4102 Sepsis due to Methicillin resistant Staphylococcus aureus: Secondary | ICD-10-CM | POA: Diagnosis not present

## 2015-09-03 DIAGNOSIS — E11622 Type 2 diabetes mellitus with other skin ulcer: Secondary | ICD-10-CM | POA: Diagnosis not present

## 2015-09-03 DIAGNOSIS — R2689 Other abnormalities of gait and mobility: Secondary | ICD-10-CM | POA: Diagnosis not present

## 2015-09-03 DIAGNOSIS — L89614 Pressure ulcer of right heel, stage 4: Secondary | ICD-10-CM | POA: Diagnosis not present

## 2015-09-03 DIAGNOSIS — I950 Idiopathic hypotension: Secondary | ICD-10-CM | POA: Diagnosis not present

## 2015-09-03 DIAGNOSIS — E1121 Type 2 diabetes mellitus with diabetic nephropathy: Secondary | ICD-10-CM | POA: Diagnosis not present

## 2015-09-03 DIAGNOSIS — R6520 Severe sepsis without septic shock: Secondary | ICD-10-CM | POA: Diagnosis not present

## 2015-09-03 DIAGNOSIS — R296 Repeated falls: Secondary | ICD-10-CM | POA: Diagnosis not present

## 2015-09-03 DIAGNOSIS — L97414 Non-pressure chronic ulcer of right heel and midfoot with necrosis of bone: Secondary | ICD-10-CM | POA: Diagnosis not present

## 2015-09-03 DIAGNOSIS — R161 Splenomegaly, not elsewhere classified: Secondary | ICD-10-CM | POA: Diagnosis not present

## 2015-09-03 DIAGNOSIS — L89154 Pressure ulcer of sacral region, stage 4: Secondary | ICD-10-CM | POA: Diagnosis not present

## 2015-09-03 DIAGNOSIS — R5381 Other malaise: Secondary | ICD-10-CM | POA: Diagnosis not present

## 2015-09-03 DIAGNOSIS — L89150 Pressure ulcer of sacral region, unstageable: Secondary | ICD-10-CM | POA: Diagnosis not present

## 2015-09-04 ENCOUNTER — Telehealth: Payer: Self-pay

## 2015-09-04 NOTE — Telephone Encounter (Signed)
Pt would like [name] BostonMichael [name] to fill out paperwork-FL2. This paperwork will say he can not take care of himself and he would like to be put in a nursing facility. He has had extreme diarrhea and bed sores.   He is now in rehab in [place] and can not bring the paper up here. CB # [phone]

## 2015-09-06 DIAGNOSIS — L89150 Pressure ulcer of sacral region, unstageable: Secondary | ICD-10-CM | POA: Diagnosis not present

## 2015-09-06 DIAGNOSIS — L89614 Pressure ulcer of right heel, stage 4: Secondary | ICD-10-CM | POA: Diagnosis not present

## 2015-09-07 DIAGNOSIS — L89150 Pressure ulcer of sacral region, unstageable: Secondary | ICD-10-CM | POA: Diagnosis not present

## 2015-09-07 DIAGNOSIS — L89614 Pressure ulcer of right heel, stage 4: Secondary | ICD-10-CM | POA: Diagnosis not present

## 2015-09-08 DIAGNOSIS — L89614 Pressure ulcer of right heel, stage 4: Secondary | ICD-10-CM | POA: Diagnosis not present

## 2015-09-08 DIAGNOSIS — L89150 Pressure ulcer of sacral region, unstageable: Secondary | ICD-10-CM | POA: Diagnosis not present

## 2015-09-09 DIAGNOSIS — Z792 Long term (current) use of antibiotics: Secondary | ICD-10-CM | POA: Diagnosis not present

## 2015-09-09 DIAGNOSIS — A4902 Methicillin resistant Staphylococcus aureus infection, unspecified site: Secondary | ICD-10-CM | POA: Diagnosis not present

## 2015-09-10 DIAGNOSIS — L89150 Pressure ulcer of sacral region, unstageable: Secondary | ICD-10-CM | POA: Diagnosis not present

## 2015-09-10 DIAGNOSIS — L89614 Pressure ulcer of right heel, stage 4: Secondary | ICD-10-CM | POA: Diagnosis not present

## 2015-09-11 DIAGNOSIS — Z452 Encounter for adjustment and management of vascular access device: Secondary | ICD-10-CM | POA: Diagnosis not present

## 2015-09-11 DIAGNOSIS — Z792 Long term (current) use of antibiotics: Secondary | ICD-10-CM | POA: Diagnosis not present

## 2015-09-11 DIAGNOSIS — Z7982 Long term (current) use of aspirin: Secondary | ICD-10-CM | POA: Diagnosis not present

## 2015-09-11 DIAGNOSIS — D631 Anemia in chronic kidney disease: Secondary | ICD-10-CM | POA: Diagnosis not present

## 2015-09-11 DIAGNOSIS — Z89511 Acquired absence of right leg below knee: Secondary | ICD-10-CM | POA: Diagnosis not present

## 2015-09-11 DIAGNOSIS — I9589 Other hypotension: Secondary | ICD-10-CM | POA: Diagnosis not present

## 2015-09-11 DIAGNOSIS — J811 Chronic pulmonary edema: Secondary | ICD-10-CM | POA: Diagnosis not present

## 2015-09-11 DIAGNOSIS — R2689 Other abnormalities of gait and mobility: Secondary | ICD-10-CM | POA: Diagnosis not present

## 2015-09-11 DIAGNOSIS — E872 Acidosis: Secondary | ICD-10-CM | POA: Diagnosis not present

## 2015-09-11 DIAGNOSIS — L89159 Pressure ulcer of sacral region, unspecified stage: Secondary | ICD-10-CM | POA: Diagnosis not present

## 2015-09-11 DIAGNOSIS — M7989 Other specified soft tissue disorders: Secondary | ICD-10-CM | POA: Diagnosis not present

## 2015-09-11 DIAGNOSIS — E11622 Type 2 diabetes mellitus with other skin ulcer: Secondary | ICD-10-CM | POA: Diagnosis not present

## 2015-09-11 DIAGNOSIS — J90 Pleural effusion, not elsewhere classified: Secondary | ICD-10-CM | POA: Diagnosis not present

## 2015-09-11 DIAGNOSIS — L89609 Pressure ulcer of unspecified heel, unspecified stage: Secondary | ICD-10-CM | POA: Diagnosis not present

## 2015-09-11 DIAGNOSIS — E119 Type 2 diabetes mellitus without complications: Secondary | ICD-10-CM | POA: Diagnosis not present

## 2015-09-11 DIAGNOSIS — N178 Other acute kidney failure: Secondary | ICD-10-CM | POA: Diagnosis not present

## 2015-09-11 DIAGNOSIS — J9811 Atelectasis: Secondary | ICD-10-CM | POA: Diagnosis not present

## 2015-09-11 DIAGNOSIS — N170 Acute kidney failure with tubular necrosis: Secondary | ICD-10-CM | POA: Diagnosis not present

## 2015-09-11 DIAGNOSIS — E160 Drug-induced hypoglycemia without coma: Secondary | ICD-10-CM | POA: Diagnosis not present

## 2015-09-11 DIAGNOSIS — M25571 Pain in right ankle and joints of right foot: Secondary | ICD-10-CM | POA: Diagnosis not present

## 2015-09-11 DIAGNOSIS — E1142 Type 2 diabetes mellitus with diabetic polyneuropathy: Secondary | ICD-10-CM | POA: Diagnosis not present

## 2015-09-11 DIAGNOSIS — Z79899 Other long term (current) drug therapy: Secondary | ICD-10-CM | POA: Diagnosis not present

## 2015-09-11 DIAGNOSIS — E1121 Type 2 diabetes mellitus with diabetic nephropathy: Secondary | ICD-10-CM | POA: Diagnosis not present

## 2015-09-11 DIAGNOSIS — A4102 Sepsis due to Methicillin resistant Staphylococcus aureus: Secondary | ICD-10-CM | POA: Diagnosis not present

## 2015-09-11 DIAGNOSIS — E11628 Type 2 diabetes mellitus with other skin complications: Secondary | ICD-10-CM | POA: Diagnosis not present

## 2015-09-11 DIAGNOSIS — N179 Acute kidney failure, unspecified: Secondary | ICD-10-CM | POA: Diagnosis not present

## 2015-09-11 DIAGNOSIS — R5381 Other malaise: Secondary | ICD-10-CM | POA: Diagnosis not present

## 2015-09-11 DIAGNOSIS — L89154 Pressure ulcer of sacral region, stage 4: Secondary | ICD-10-CM | POA: Diagnosis not present

## 2015-09-11 DIAGNOSIS — R6520 Severe sepsis without septic shock: Secondary | ICD-10-CM | POA: Diagnosis not present

## 2015-09-11 DIAGNOSIS — I2510 Atherosclerotic heart disease of native coronary artery without angina pectoris: Secondary | ICD-10-CM | POA: Diagnosis not present

## 2015-09-11 DIAGNOSIS — K626 Ulcer of anus and rectum: Secondary | ICD-10-CM | POA: Diagnosis not present

## 2015-09-11 DIAGNOSIS — M79604 Pain in right leg: Secondary | ICD-10-CM | POA: Diagnosis not present

## 2015-09-11 DIAGNOSIS — I70235 Atherosclerosis of native arteries of right leg with ulceration of other part of foot: Secondary | ICD-10-CM | POA: Diagnosis not present

## 2015-09-11 DIAGNOSIS — I96 Gangrene, not elsewhere classified: Secondary | ICD-10-CM | POA: Diagnosis not present

## 2015-09-11 DIAGNOSIS — M86671 Other chronic osteomyelitis, right ankle and foot: Secondary | ICD-10-CM | POA: Diagnosis not present

## 2015-09-11 DIAGNOSIS — R7989 Other specified abnormal findings of blood chemistry: Secondary | ICD-10-CM | POA: Diagnosis not present

## 2015-09-11 DIAGNOSIS — E09649 Drug or chemical induced diabetes mellitus with hypoglycemia without coma: Secondary | ICD-10-CM | POA: Diagnosis not present

## 2015-09-11 DIAGNOSIS — N186 End stage renal disease: Secondary | ICD-10-CM | POA: Diagnosis not present

## 2015-09-11 DIAGNOSIS — A4902 Methicillin resistant Staphylococcus aureus infection, unspecified site: Secondary | ICD-10-CM | POA: Diagnosis not present

## 2015-09-11 DIAGNOSIS — I959 Hypotension, unspecified: Secondary | ICD-10-CM | POA: Diagnosis not present

## 2015-09-11 DIAGNOSIS — E1169 Type 2 diabetes mellitus with other specified complication: Secondary | ICD-10-CM | POA: Diagnosis not present

## 2015-09-11 DIAGNOSIS — E11621 Type 2 diabetes mellitus with foot ulcer: Secondary | ICD-10-CM | POA: Diagnosis not present

## 2015-09-11 DIAGNOSIS — E1165 Type 2 diabetes mellitus with hyperglycemia: Secondary | ICD-10-CM | POA: Diagnosis not present

## 2015-09-11 DIAGNOSIS — D638 Anemia in other chronic diseases classified elsewhere: Secondary | ICD-10-CM | POA: Diagnosis not present

## 2015-09-11 DIAGNOSIS — L89614 Pressure ulcer of right heel, stage 4: Secondary | ICD-10-CM | POA: Diagnosis not present

## 2015-09-11 DIAGNOSIS — J9600 Acute respiratory failure, unspecified whether with hypoxia or hypercapnia: Secondary | ICD-10-CM | POA: Diagnosis not present

## 2015-09-11 DIAGNOSIS — Z7409 Other reduced mobility: Secondary | ICD-10-CM | POA: Diagnosis not present

## 2015-09-11 DIAGNOSIS — L89150 Pressure ulcer of sacral region, unstageable: Secondary | ICD-10-CM | POA: Diagnosis not present

## 2015-09-11 DIAGNOSIS — Z451 Encounter for adjustment and management of infusion pump: Secondary | ICD-10-CM | POA: Diagnosis not present

## 2015-09-11 DIAGNOSIS — L97519 Non-pressure chronic ulcer of other part of right foot with unspecified severity: Secondary | ICD-10-CM | POA: Diagnosis not present

## 2015-09-11 DIAGNOSIS — R531 Weakness: Secondary | ICD-10-CM | POA: Diagnosis not present

## 2015-09-11 DIAGNOSIS — G629 Polyneuropathy, unspecified: Secondary | ICD-10-CM | POA: Diagnosis not present

## 2015-09-11 DIAGNOSIS — Z87891 Personal history of nicotine dependence: Secondary | ICD-10-CM | POA: Diagnosis not present

## 2015-09-11 DIAGNOSIS — R197 Diarrhea, unspecified: Secondary | ICD-10-CM | POA: Diagnosis not present

## 2015-09-11 DIAGNOSIS — L97414 Non-pressure chronic ulcer of right heel and midfoot with necrosis of bone: Secondary | ICD-10-CM | POA: Diagnosis not present

## 2015-09-11 DIAGNOSIS — Z9181 History of falling: Secondary | ICD-10-CM | POA: Diagnosis not present

## 2015-09-11 DIAGNOSIS — J9601 Acute respiratory failure with hypoxia: Secondary | ICD-10-CM | POA: Diagnosis not present

## 2015-09-11 DIAGNOSIS — R918 Other nonspecific abnormal finding of lung field: Secondary | ICD-10-CM | POA: Diagnosis not present

## 2015-09-11 DIAGNOSIS — M86171 Other acute osteomyelitis, right ankle and foot: Secondary | ICD-10-CM | POA: Diagnosis not present

## 2015-09-11 DIAGNOSIS — Z794 Long term (current) use of insulin: Secondary | ICD-10-CM | POA: Diagnosis not present

## 2015-09-11 DIAGNOSIS — M868X7 Other osteomyelitis, ankle and foot: Secondary | ICD-10-CM | POA: Diagnosis not present

## 2015-09-11 DIAGNOSIS — D500 Iron deficiency anemia secondary to blood loss (chronic): Secondary | ICD-10-CM | POA: Diagnosis not present

## 2015-09-11 DIAGNOSIS — Z79891 Long term (current) use of opiate analgesic: Secondary | ICD-10-CM | POA: Diagnosis not present

## 2015-09-11 DIAGNOSIS — E1122 Type 2 diabetes mellitus with diabetic chronic kidney disease: Secondary | ICD-10-CM | POA: Diagnosis not present

## 2015-09-11 DIAGNOSIS — I950 Idiopathic hypotension: Secondary | ICD-10-CM | POA: Diagnosis not present

## 2015-09-11 DIAGNOSIS — M86271 Subacute osteomyelitis, right ankle and foot: Secondary | ICD-10-CM | POA: Diagnosis not present

## 2015-09-11 DIAGNOSIS — L98493 Non-pressure chronic ulcer of skin of other sites with necrosis of muscle: Secondary | ICD-10-CM | POA: Diagnosis not present

## 2015-09-11 DIAGNOSIS — R161 Splenomegaly, not elsewhere classified: Secondary | ICD-10-CM | POA: Diagnosis not present

## 2015-09-11 DIAGNOSIS — R296 Repeated falls: Secondary | ICD-10-CM | POA: Diagnosis not present

## 2015-09-11 DIAGNOSIS — M6281 Muscle weakness (generalized): Secondary | ICD-10-CM | POA: Diagnosis not present

## 2015-09-11 NOTE — Telephone Encounter (Signed)
I have seen this patient once.  I have tried to get him to come into the office for a rechecks.  I know very little about his current status and can not accurately fill out any disability forms on his behalf without seeing him. Deliah BostonMichael Clark, MS, PA-C   12:26 AM, 09/11/2015

## 2015-09-28 ENCOUNTER — Other Ambulatory Visit: Payer: Self-pay | Admitting: Family Medicine

## 2015-10-04 DIAGNOSIS — Z114 Encounter for screening for human immunodeficiency virus [HIV]: Secondary | ICD-10-CM | POA: Diagnosis not present

## 2015-10-04 DIAGNOSIS — L89154 Pressure ulcer of sacral region, stage 4: Secondary | ICD-10-CM | POA: Diagnosis not present

## 2015-10-04 DIAGNOSIS — E872 Acidosis: Secondary | ICD-10-CM | POA: Diagnosis not present

## 2015-10-04 DIAGNOSIS — Z418 Encounter for other procedures for purposes other than remedying health state: Secondary | ICD-10-CM | POA: Diagnosis not present

## 2015-10-04 DIAGNOSIS — E119 Type 2 diabetes mellitus without complications: Secondary | ICD-10-CM | POA: Diagnosis not present

## 2015-10-04 DIAGNOSIS — I120 Hypertensive chronic kidney disease with stage 5 chronic kidney disease or end stage renal disease: Secondary | ICD-10-CM | POA: Diagnosis not present

## 2015-10-04 DIAGNOSIS — I252 Old myocardial infarction: Secondary | ICD-10-CM | POA: Diagnosis not present

## 2015-10-04 DIAGNOSIS — L89153 Pressure ulcer of sacral region, stage 3: Secondary | ICD-10-CM | POA: Diagnosis not present

## 2015-10-04 DIAGNOSIS — N186 End stage renal disease: Secondary | ICD-10-CM | POA: Diagnosis not present

## 2015-10-04 DIAGNOSIS — Z79899 Other long term (current) drug therapy: Secondary | ICD-10-CM | POA: Diagnosis not present

## 2015-10-04 DIAGNOSIS — L89614 Pressure ulcer of right heel, stage 4: Secondary | ICD-10-CM | POA: Diagnosis not present

## 2015-10-04 DIAGNOSIS — E11622 Type 2 diabetes mellitus with other skin ulcer: Secondary | ICD-10-CM | POA: Diagnosis not present

## 2015-10-04 DIAGNOSIS — L89150 Pressure ulcer of sacral region, unstageable: Secondary | ICD-10-CM | POA: Diagnosis not present

## 2015-10-04 DIAGNOSIS — Z9842 Cataract extraction status, left eye: Secondary | ICD-10-CM | POA: Diagnosis not present

## 2015-10-04 DIAGNOSIS — N179 Acute kidney failure, unspecified: Secondary | ICD-10-CM | POA: Diagnosis not present

## 2015-10-04 DIAGNOSIS — Z794 Long term (current) use of insulin: Secondary | ICD-10-CM | POA: Diagnosis not present

## 2015-10-04 DIAGNOSIS — I1311 Hypertensive heart and chronic kidney disease without heart failure, with stage 5 chronic kidney disease, or end stage renal disease: Secondary | ICD-10-CM | POA: Diagnosis not present

## 2015-10-04 DIAGNOSIS — I2510 Atherosclerotic heart disease of native coronary artery without angina pectoris: Secondary | ICD-10-CM | POA: Diagnosis not present

## 2015-10-04 DIAGNOSIS — D638 Anemia in other chronic diseases classified elsewhere: Secondary | ICD-10-CM | POA: Diagnosis not present

## 2015-10-04 DIAGNOSIS — Z451 Encounter for adjustment and management of infusion pump: Secondary | ICD-10-CM | POA: Diagnosis not present

## 2015-10-04 DIAGNOSIS — R531 Weakness: Secondary | ICD-10-CM | POA: Diagnosis not present

## 2015-10-04 DIAGNOSIS — T8131XA Disruption of external operation (surgical) wound, not elsewhere classified, initial encounter: Secondary | ICD-10-CM | POA: Diagnosis not present

## 2015-10-04 DIAGNOSIS — Z1159 Encounter for screening for other viral diseases: Secondary | ICD-10-CM | POA: Diagnosis not present

## 2015-10-04 DIAGNOSIS — R2689 Other abnormalities of gait and mobility: Secondary | ICD-10-CM | POA: Diagnosis not present

## 2015-10-04 DIAGNOSIS — N2581 Secondary hyperparathyroidism of renal origin: Secondary | ICD-10-CM | POA: Diagnosis not present

## 2015-10-04 DIAGNOSIS — Z9841 Cataract extraction status, right eye: Secondary | ICD-10-CM | POA: Diagnosis not present

## 2015-10-04 DIAGNOSIS — Z23 Encounter for immunization: Secondary | ICD-10-CM | POA: Diagnosis not present

## 2015-10-04 DIAGNOSIS — T879 Unspecified complications of amputation stump: Secondary | ICD-10-CM | POA: Diagnosis not present

## 2015-10-04 DIAGNOSIS — Z7409 Other reduced mobility: Secondary | ICD-10-CM | POA: Diagnosis not present

## 2015-10-04 DIAGNOSIS — M6281 Muscle weakness (generalized): Secondary | ICD-10-CM | POA: Diagnosis not present

## 2015-10-04 DIAGNOSIS — E11621 Type 2 diabetes mellitus with foot ulcer: Secondary | ICD-10-CM | POA: Diagnosis not present

## 2015-10-04 DIAGNOSIS — D509 Iron deficiency anemia, unspecified: Secondary | ICD-10-CM | POA: Diagnosis not present

## 2015-10-04 DIAGNOSIS — Z792 Long term (current) use of antibiotics: Secondary | ICD-10-CM | POA: Diagnosis not present

## 2015-10-04 DIAGNOSIS — Z8673 Personal history of transient ischemic attack (TIA), and cerebral infarction without residual deficits: Secondary | ICD-10-CM | POA: Diagnosis not present

## 2015-10-04 DIAGNOSIS — T8789 Other complications of amputation stump: Secondary | ICD-10-CM | POA: Diagnosis not present

## 2015-10-04 DIAGNOSIS — M25571 Pain in right ankle and joints of right foot: Secondary | ICD-10-CM | POA: Diagnosis not present

## 2015-10-04 DIAGNOSIS — Z992 Dependence on renal dialysis: Secondary | ICD-10-CM | POA: Diagnosis not present

## 2015-10-04 DIAGNOSIS — Z89511 Acquired absence of right leg below knee: Secondary | ICD-10-CM | POA: Diagnosis not present

## 2015-10-04 DIAGNOSIS — E1142 Type 2 diabetes mellitus with diabetic polyneuropathy: Secondary | ICD-10-CM | POA: Diagnosis not present

## 2015-10-04 DIAGNOSIS — E1122 Type 2 diabetes mellitus with diabetic chronic kidney disease: Secondary | ICD-10-CM | POA: Diagnosis not present

## 2015-10-04 DIAGNOSIS — Z7982 Long term (current) use of aspirin: Secondary | ICD-10-CM | POA: Diagnosis not present

## 2015-10-04 DIAGNOSIS — R296 Repeated falls: Secondary | ICD-10-CM | POA: Diagnosis not present

## 2015-10-04 DIAGNOSIS — M86171 Other acute osteomyelitis, right ankle and foot: Secondary | ICD-10-CM | POA: Diagnosis not present

## 2015-10-04 DIAGNOSIS — Z87891 Personal history of nicotine dependence: Secondary | ICD-10-CM | POA: Diagnosis not present

## 2015-10-04 DIAGNOSIS — D631 Anemia in chronic kidney disease: Secondary | ICD-10-CM | POA: Diagnosis not present

## 2015-10-04 DIAGNOSIS — G629 Polyneuropathy, unspecified: Secondary | ICD-10-CM | POA: Diagnosis not present

## 2015-10-08 DIAGNOSIS — Z23 Encounter for immunization: Secondary | ICD-10-CM | POA: Diagnosis not present

## 2015-10-08 DIAGNOSIS — N2581 Secondary hyperparathyroidism of renal origin: Secondary | ICD-10-CM | POA: Diagnosis not present

## 2015-10-08 DIAGNOSIS — D509 Iron deficiency anemia, unspecified: Secondary | ICD-10-CM | POA: Diagnosis not present

## 2015-10-08 DIAGNOSIS — N186 End stage renal disease: Secondary | ICD-10-CM | POA: Diagnosis not present

## 2015-10-08 DIAGNOSIS — Z418 Encounter for other procedures for purposes other than remedying health state: Secondary | ICD-10-CM | POA: Diagnosis not present

## 2015-10-08 DIAGNOSIS — D631 Anemia in chronic kidney disease: Secondary | ICD-10-CM | POA: Diagnosis not present

## 2015-10-09 ENCOUNTER — Ambulatory Visit: Payer: Medicare Other | Admitting: Physician Assistant

## 2015-10-10 DIAGNOSIS — D631 Anemia in chronic kidney disease: Secondary | ICD-10-CM | POA: Diagnosis not present

## 2015-10-10 DIAGNOSIS — N186 End stage renal disease: Secondary | ICD-10-CM | POA: Diagnosis not present

## 2015-10-10 DIAGNOSIS — Z418 Encounter for other procedures for purposes other than remedying health state: Secondary | ICD-10-CM | POA: Diagnosis not present

## 2015-10-10 DIAGNOSIS — D509 Iron deficiency anemia, unspecified: Secondary | ICD-10-CM | POA: Diagnosis not present

## 2015-10-10 DIAGNOSIS — N2581 Secondary hyperparathyroidism of renal origin: Secondary | ICD-10-CM | POA: Diagnosis not present

## 2015-10-10 DIAGNOSIS — Z23 Encounter for immunization: Secondary | ICD-10-CM | POA: Diagnosis not present

## 2015-10-12 DIAGNOSIS — N186 End stage renal disease: Secondary | ICD-10-CM | POA: Diagnosis not present

## 2015-10-12 DIAGNOSIS — D631 Anemia in chronic kidney disease: Secondary | ICD-10-CM | POA: Diagnosis not present

## 2015-10-12 DIAGNOSIS — D509 Iron deficiency anemia, unspecified: Secondary | ICD-10-CM | POA: Diagnosis not present

## 2015-10-12 DIAGNOSIS — N2581 Secondary hyperparathyroidism of renal origin: Secondary | ICD-10-CM | POA: Diagnosis not present

## 2015-10-12 DIAGNOSIS — Z418 Encounter for other procedures for purposes other than remedying health state: Secondary | ICD-10-CM | POA: Diagnosis not present

## 2015-10-12 DIAGNOSIS — Z23 Encounter for immunization: Secondary | ICD-10-CM | POA: Diagnosis not present

## 2015-10-14 DIAGNOSIS — I1311 Hypertensive heart and chronic kidney disease without heart failure, with stage 5 chronic kidney disease, or end stage renal disease: Secondary | ICD-10-CM | POA: Diagnosis not present

## 2015-10-14 DIAGNOSIS — L89153 Pressure ulcer of sacral region, stage 3: Secondary | ICD-10-CM | POA: Diagnosis not present

## 2015-10-14 DIAGNOSIS — N186 End stage renal disease: Secondary | ICD-10-CM | POA: Diagnosis not present

## 2015-10-14 DIAGNOSIS — Z87891 Personal history of nicotine dependence: Secondary | ICD-10-CM | POA: Diagnosis not present

## 2015-10-14 DIAGNOSIS — E11622 Type 2 diabetes mellitus with other skin ulcer: Secondary | ICD-10-CM | POA: Diagnosis not present

## 2015-10-14 DIAGNOSIS — Z8673 Personal history of transient ischemic attack (TIA), and cerebral infarction without residual deficits: Secondary | ICD-10-CM | POA: Diagnosis not present

## 2015-10-14 DIAGNOSIS — I2510 Atherosclerotic heart disease of native coronary artery without angina pectoris: Secondary | ICD-10-CM | POA: Diagnosis not present

## 2015-10-14 DIAGNOSIS — E1122 Type 2 diabetes mellitus with diabetic chronic kidney disease: Secondary | ICD-10-CM | POA: Diagnosis not present

## 2015-10-14 DIAGNOSIS — Z992 Dependence on renal dialysis: Secondary | ICD-10-CM | POA: Diagnosis not present

## 2015-10-14 DIAGNOSIS — T8131XA Disruption of external operation (surgical) wound, not elsewhere classified, initial encounter: Secondary | ICD-10-CM | POA: Diagnosis not present

## 2015-10-15 DIAGNOSIS — N186 End stage renal disease: Secondary | ICD-10-CM | POA: Diagnosis not present

## 2015-10-15 DIAGNOSIS — Z418 Encounter for other procedures for purposes other than remedying health state: Secondary | ICD-10-CM | POA: Diagnosis not present

## 2015-10-15 DIAGNOSIS — D509 Iron deficiency anemia, unspecified: Secondary | ICD-10-CM | POA: Diagnosis not present

## 2015-10-15 DIAGNOSIS — D631 Anemia in chronic kidney disease: Secondary | ICD-10-CM | POA: Diagnosis not present

## 2015-10-17 DIAGNOSIS — Z418 Encounter for other procedures for purposes other than remedying health state: Secondary | ICD-10-CM | POA: Diagnosis not present

## 2015-10-17 DIAGNOSIS — D509 Iron deficiency anemia, unspecified: Secondary | ICD-10-CM | POA: Diagnosis not present

## 2015-10-17 DIAGNOSIS — D631 Anemia in chronic kidney disease: Secondary | ICD-10-CM | POA: Diagnosis not present

## 2015-10-17 DIAGNOSIS — N186 End stage renal disease: Secondary | ICD-10-CM | POA: Diagnosis not present

## 2015-10-19 DIAGNOSIS — D509 Iron deficiency anemia, unspecified: Secondary | ICD-10-CM | POA: Diagnosis not present

## 2015-10-19 DIAGNOSIS — N186 End stage renal disease: Secondary | ICD-10-CM | POA: Diagnosis not present

## 2015-10-19 DIAGNOSIS — D631 Anemia in chronic kidney disease: Secondary | ICD-10-CM | POA: Diagnosis not present

## 2015-10-19 DIAGNOSIS — Z418 Encounter for other procedures for purposes other than remedying health state: Secondary | ICD-10-CM | POA: Diagnosis not present

## 2015-10-21 DIAGNOSIS — I1311 Hypertensive heart and chronic kidney disease without heart failure, with stage 5 chronic kidney disease, or end stage renal disease: Secondary | ICD-10-CM | POA: Diagnosis not present

## 2015-10-21 DIAGNOSIS — Z8673 Personal history of transient ischemic attack (TIA), and cerebral infarction without residual deficits: Secondary | ICD-10-CM | POA: Diagnosis not present

## 2015-10-21 DIAGNOSIS — L89153 Pressure ulcer of sacral region, stage 3: Secondary | ICD-10-CM | POA: Diagnosis not present

## 2015-10-21 DIAGNOSIS — E11621 Type 2 diabetes mellitus with foot ulcer: Secondary | ICD-10-CM | POA: Diagnosis not present

## 2015-10-21 DIAGNOSIS — Z992 Dependence on renal dialysis: Secondary | ICD-10-CM | POA: Diagnosis not present

## 2015-10-21 DIAGNOSIS — I2510 Atherosclerotic heart disease of native coronary artery without angina pectoris: Secondary | ICD-10-CM | POA: Diagnosis not present

## 2015-10-21 DIAGNOSIS — Z87891 Personal history of nicotine dependence: Secondary | ICD-10-CM | POA: Diagnosis not present

## 2015-10-21 DIAGNOSIS — T8131XA Disruption of external operation (surgical) wound, not elsewhere classified, initial encounter: Secondary | ICD-10-CM | POA: Diagnosis not present

## 2015-10-21 DIAGNOSIS — E11622 Type 2 diabetes mellitus with other skin ulcer: Secondary | ICD-10-CM | POA: Diagnosis not present

## 2015-10-21 DIAGNOSIS — N186 End stage renal disease: Secondary | ICD-10-CM | POA: Diagnosis not present

## 2015-10-21 DIAGNOSIS — E1122 Type 2 diabetes mellitus with diabetic chronic kidney disease: Secondary | ICD-10-CM | POA: Diagnosis not present

## 2015-10-22 DIAGNOSIS — T8131XA Disruption of external operation (surgical) wound, not elsewhere classified, initial encounter: Secondary | ICD-10-CM | POA: Diagnosis not present

## 2015-10-22 DIAGNOSIS — E11622 Type 2 diabetes mellitus with other skin ulcer: Secondary | ICD-10-CM | POA: Diagnosis not present

## 2015-10-22 DIAGNOSIS — E1122 Type 2 diabetes mellitus with diabetic chronic kidney disease: Secondary | ICD-10-CM | POA: Diagnosis not present

## 2015-10-22 DIAGNOSIS — I2510 Atherosclerotic heart disease of native coronary artery without angina pectoris: Secondary | ICD-10-CM | POA: Diagnosis not present

## 2015-10-22 DIAGNOSIS — Z8673 Personal history of transient ischemic attack (TIA), and cerebral infarction without residual deficits: Secondary | ICD-10-CM | POA: Diagnosis not present

## 2015-10-22 DIAGNOSIS — Z992 Dependence on renal dialysis: Secondary | ICD-10-CM | POA: Diagnosis not present

## 2015-10-22 DIAGNOSIS — D631 Anemia in chronic kidney disease: Secondary | ICD-10-CM | POA: Diagnosis not present

## 2015-10-22 DIAGNOSIS — L89153 Pressure ulcer of sacral region, stage 3: Secondary | ICD-10-CM | POA: Diagnosis not present

## 2015-10-22 DIAGNOSIS — I1311 Hypertensive heart and chronic kidney disease without heart failure, with stage 5 chronic kidney disease, or end stage renal disease: Secondary | ICD-10-CM | POA: Diagnosis not present

## 2015-10-22 DIAGNOSIS — Z418 Encounter for other procedures for purposes other than remedying health state: Secondary | ICD-10-CM | POA: Diagnosis not present

## 2015-10-22 DIAGNOSIS — N186 End stage renal disease: Secondary | ICD-10-CM | POA: Diagnosis not present

## 2015-10-22 DIAGNOSIS — Z87891 Personal history of nicotine dependence: Secondary | ICD-10-CM | POA: Diagnosis not present

## 2015-10-22 DIAGNOSIS — D509 Iron deficiency anemia, unspecified: Secondary | ICD-10-CM | POA: Diagnosis not present

## 2015-10-24 DIAGNOSIS — N186 End stage renal disease: Secondary | ICD-10-CM | POA: Diagnosis not present

## 2015-10-24 DIAGNOSIS — D509 Iron deficiency anemia, unspecified: Secondary | ICD-10-CM | POA: Diagnosis not present

## 2015-10-24 DIAGNOSIS — Z418 Encounter for other procedures for purposes other than remedying health state: Secondary | ICD-10-CM | POA: Diagnosis not present

## 2015-10-24 DIAGNOSIS — D631 Anemia in chronic kidney disease: Secondary | ICD-10-CM | POA: Diagnosis not present

## 2015-10-24 DIAGNOSIS — Z23 Encounter for immunization: Secondary | ICD-10-CM | POA: Diagnosis not present

## 2015-10-26 DIAGNOSIS — D509 Iron deficiency anemia, unspecified: Secondary | ICD-10-CM | POA: Diagnosis not present

## 2015-10-26 DIAGNOSIS — Z23 Encounter for immunization: Secondary | ICD-10-CM | POA: Diagnosis not present

## 2015-10-26 DIAGNOSIS — Z418 Encounter for other procedures for purposes other than remedying health state: Secondary | ICD-10-CM | POA: Diagnosis not present

## 2015-10-26 DIAGNOSIS — N186 End stage renal disease: Secondary | ICD-10-CM | POA: Diagnosis not present

## 2015-10-26 DIAGNOSIS — D631 Anemia in chronic kidney disease: Secondary | ICD-10-CM | POA: Diagnosis not present

## 2015-10-29 DIAGNOSIS — D631 Anemia in chronic kidney disease: Secondary | ICD-10-CM | POA: Diagnosis not present

## 2015-10-29 DIAGNOSIS — Z418 Encounter for other procedures for purposes other than remedying health state: Secondary | ICD-10-CM | POA: Diagnosis not present

## 2015-10-29 DIAGNOSIS — D509 Iron deficiency anemia, unspecified: Secondary | ICD-10-CM | POA: Diagnosis not present

## 2015-10-29 DIAGNOSIS — N186 End stage renal disease: Secondary | ICD-10-CM | POA: Diagnosis not present

## 2015-10-29 DIAGNOSIS — Z23 Encounter for immunization: Secondary | ICD-10-CM | POA: Diagnosis not present

## 2015-10-30 DIAGNOSIS — E11622 Type 2 diabetes mellitus with other skin ulcer: Secondary | ICD-10-CM | POA: Diagnosis not present

## 2015-10-30 DIAGNOSIS — E1122 Type 2 diabetes mellitus with diabetic chronic kidney disease: Secondary | ICD-10-CM | POA: Diagnosis not present

## 2015-10-30 DIAGNOSIS — L89153 Pressure ulcer of sacral region, stage 3: Secondary | ICD-10-CM | POA: Diagnosis not present

## 2015-10-30 DIAGNOSIS — N186 End stage renal disease: Secondary | ICD-10-CM | POA: Diagnosis not present

## 2015-10-30 DIAGNOSIS — Z992 Dependence on renal dialysis: Secondary | ICD-10-CM | POA: Diagnosis not present

## 2015-10-30 DIAGNOSIS — Z8673 Personal history of transient ischemic attack (TIA), and cerebral infarction without residual deficits: Secondary | ICD-10-CM | POA: Diagnosis not present

## 2015-10-30 DIAGNOSIS — Z87891 Personal history of nicotine dependence: Secondary | ICD-10-CM | POA: Diagnosis not present

## 2015-10-30 DIAGNOSIS — I1311 Hypertensive heart and chronic kidney disease without heart failure, with stage 5 chronic kidney disease, or end stage renal disease: Secondary | ICD-10-CM | POA: Diagnosis not present

## 2015-10-30 DIAGNOSIS — T8131XA Disruption of external operation (surgical) wound, not elsewhere classified, initial encounter: Secondary | ICD-10-CM | POA: Diagnosis not present

## 2015-10-30 DIAGNOSIS — I2510 Atherosclerotic heart disease of native coronary artery without angina pectoris: Secondary | ICD-10-CM | POA: Diagnosis not present

## 2015-10-31 DIAGNOSIS — D509 Iron deficiency anemia, unspecified: Secondary | ICD-10-CM | POA: Diagnosis not present

## 2015-10-31 DIAGNOSIS — Z23 Encounter for immunization: Secondary | ICD-10-CM | POA: Diagnosis not present

## 2015-10-31 DIAGNOSIS — N186 End stage renal disease: Secondary | ICD-10-CM | POA: Diagnosis not present

## 2015-10-31 DIAGNOSIS — Z418 Encounter for other procedures for purposes other than remedying health state: Secondary | ICD-10-CM | POA: Diagnosis not present

## 2015-10-31 DIAGNOSIS — D631 Anemia in chronic kidney disease: Secondary | ICD-10-CM | POA: Diagnosis not present

## 2015-11-02 DIAGNOSIS — Z23 Encounter for immunization: Secondary | ICD-10-CM | POA: Diagnosis not present

## 2015-11-02 DIAGNOSIS — N186 End stage renal disease: Secondary | ICD-10-CM | POA: Diagnosis not present

## 2015-11-02 DIAGNOSIS — D509 Iron deficiency anemia, unspecified: Secondary | ICD-10-CM | POA: Diagnosis not present

## 2015-11-02 DIAGNOSIS — D631 Anemia in chronic kidney disease: Secondary | ICD-10-CM | POA: Diagnosis not present

## 2015-11-02 DIAGNOSIS — Z992 Dependence on renal dialysis: Secondary | ICD-10-CM | POA: Diagnosis not present

## 2015-11-02 DIAGNOSIS — Z418 Encounter for other procedures for purposes other than remedying health state: Secondary | ICD-10-CM | POA: Diagnosis not present

## 2015-11-05 DIAGNOSIS — D631 Anemia in chronic kidney disease: Secondary | ICD-10-CM | POA: Diagnosis not present

## 2015-11-05 DIAGNOSIS — D509 Iron deficiency anemia, unspecified: Secondary | ICD-10-CM | POA: Diagnosis not present

## 2015-11-05 DIAGNOSIS — N2581 Secondary hyperparathyroidism of renal origin: Secondary | ICD-10-CM | POA: Diagnosis not present

## 2015-11-05 DIAGNOSIS — Z418 Encounter for other procedures for purposes other than remedying health state: Secondary | ICD-10-CM | POA: Diagnosis not present

## 2015-11-05 DIAGNOSIS — N186 End stage renal disease: Secondary | ICD-10-CM | POA: Diagnosis not present

## 2015-11-07 DIAGNOSIS — Z418 Encounter for other procedures for purposes other than remedying health state: Secondary | ICD-10-CM | POA: Diagnosis not present

## 2015-11-07 DIAGNOSIS — N2581 Secondary hyperparathyroidism of renal origin: Secondary | ICD-10-CM | POA: Diagnosis not present

## 2015-11-07 DIAGNOSIS — D631 Anemia in chronic kidney disease: Secondary | ICD-10-CM | POA: Diagnosis not present

## 2015-11-07 DIAGNOSIS — D509 Iron deficiency anemia, unspecified: Secondary | ICD-10-CM | POA: Diagnosis not present

## 2015-11-07 DIAGNOSIS — N186 End stage renal disease: Secondary | ICD-10-CM | POA: Diagnosis not present

## 2015-11-12 DIAGNOSIS — D631 Anemia in chronic kidney disease: Secondary | ICD-10-CM | POA: Diagnosis not present

## 2015-11-12 DIAGNOSIS — D509 Iron deficiency anemia, unspecified: Secondary | ICD-10-CM | POA: Diagnosis not present

## 2015-11-12 DIAGNOSIS — N186 End stage renal disease: Secondary | ICD-10-CM | POA: Diagnosis not present

## 2015-11-12 DIAGNOSIS — N2581 Secondary hyperparathyroidism of renal origin: Secondary | ICD-10-CM | POA: Diagnosis not present

## 2015-11-12 DIAGNOSIS — Z418 Encounter for other procedures for purposes other than remedying health state: Secondary | ICD-10-CM | POA: Diagnosis not present

## 2015-11-13 IMAGING — US US RENAL
1 series · 14 of 25 positions shown · non-contrast
Comparison: None.

CLINICAL DATA: Chronic renal disease.

EXAM:
RENAL/URINARY TRACT ULTRASOUND COMPLETE

[Series 1: us renal · 0.25mm/px · 14 of 33 slices shown]
[im 1/33]
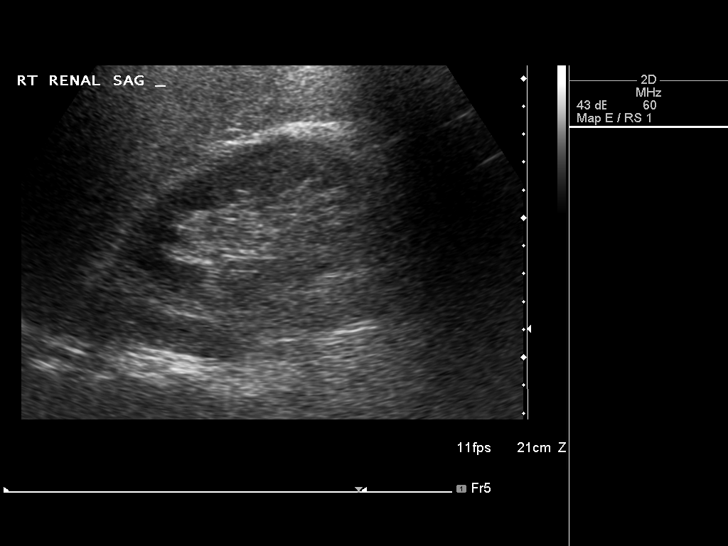
[im 3/33]
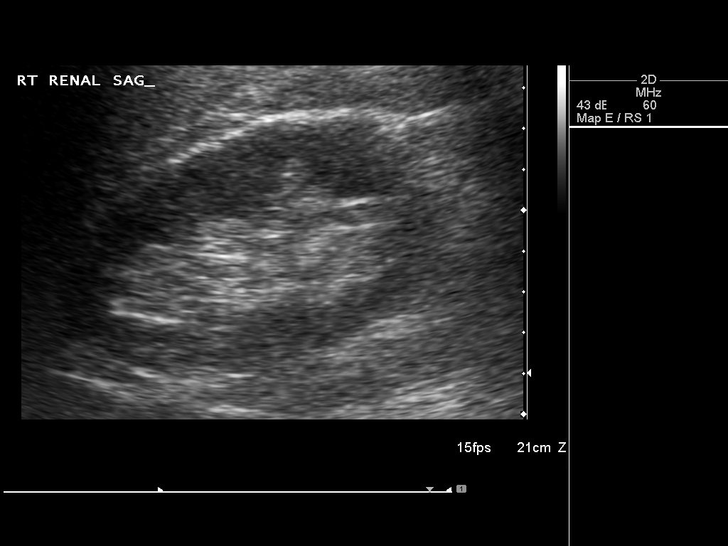
[im 6/33]
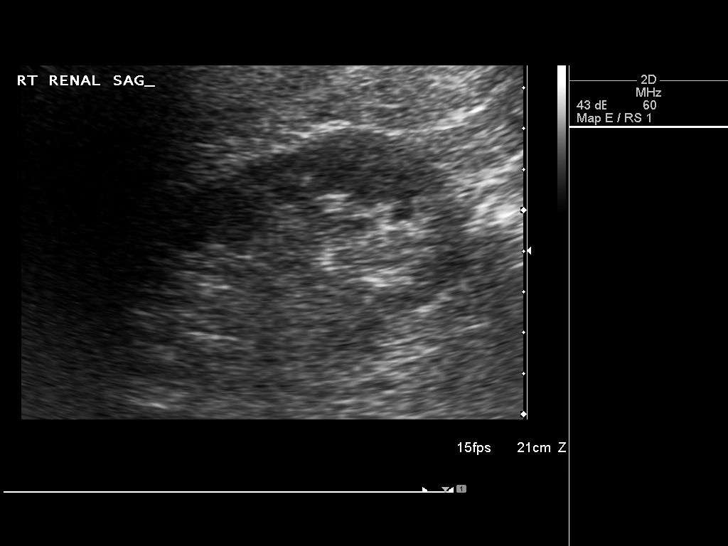
[im 9/33]
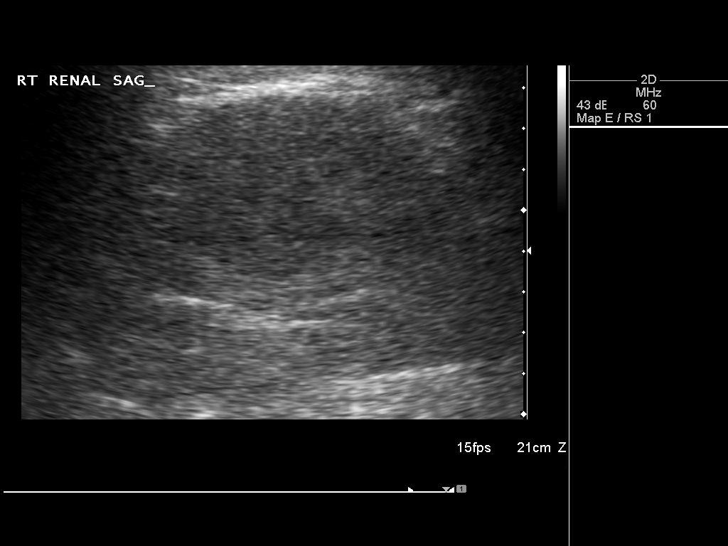
[im 11/33]
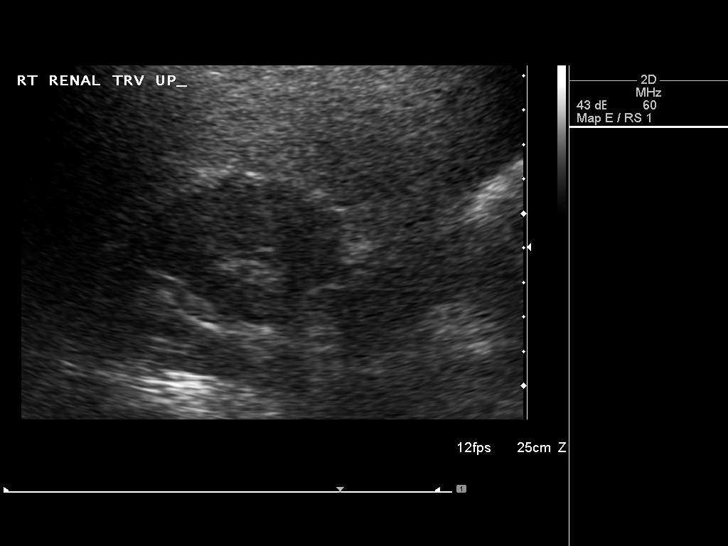
[im 13/33]
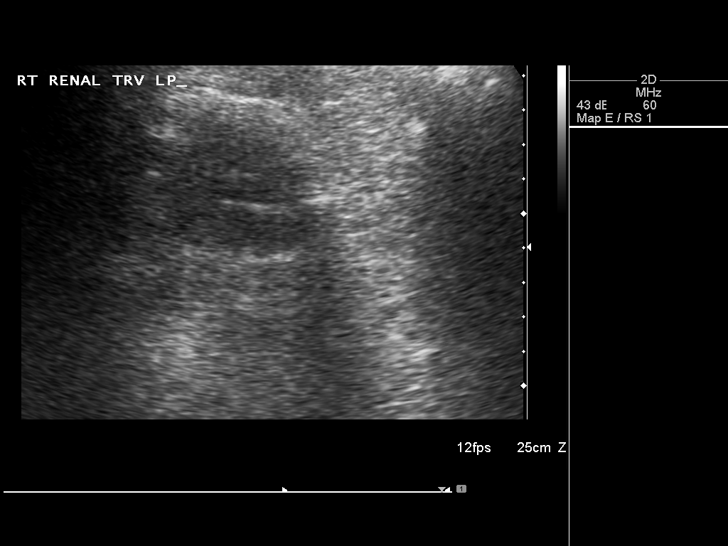
[im 15/33]
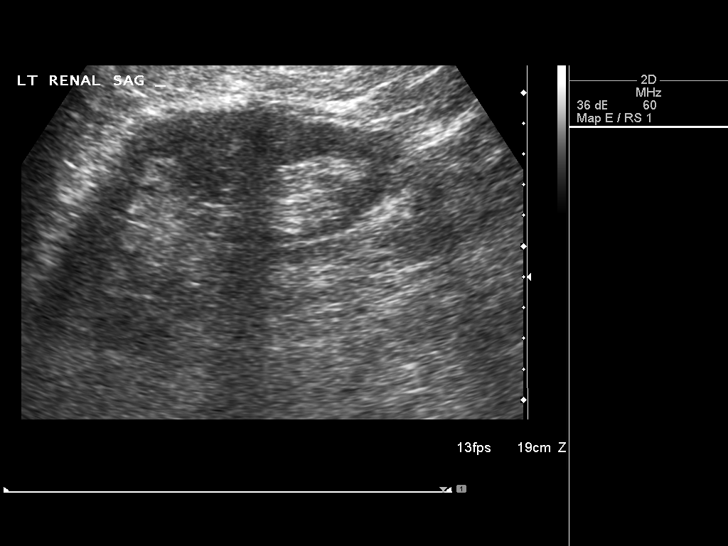
[im 18/33]
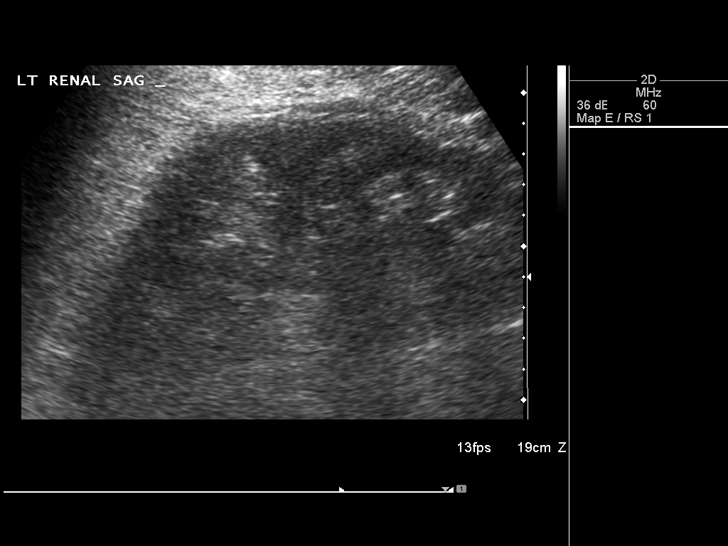
[im 21/33]
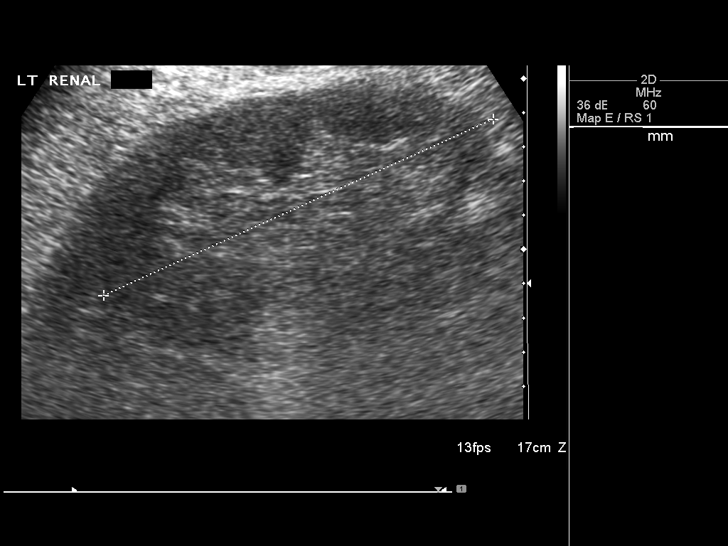
[im 22/33]
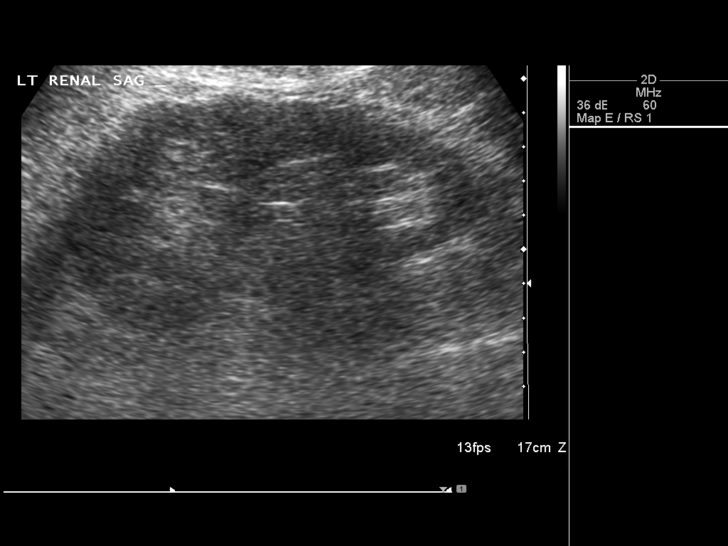
[im 25/33]
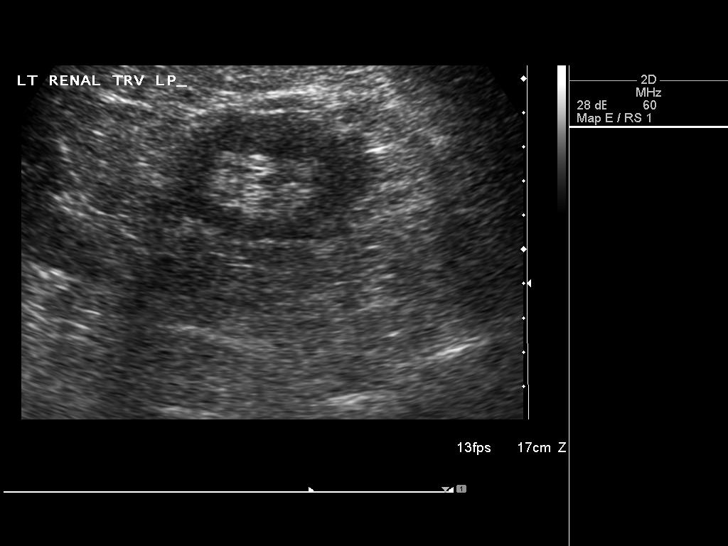
[im 27/33]
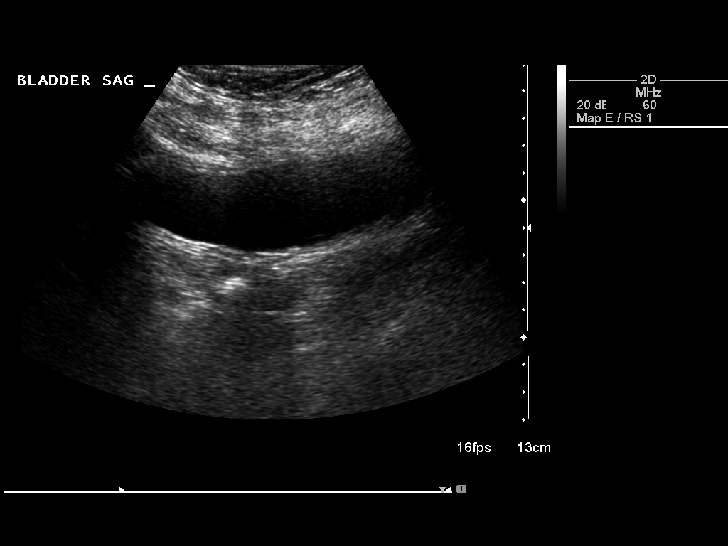
[im 30/33]
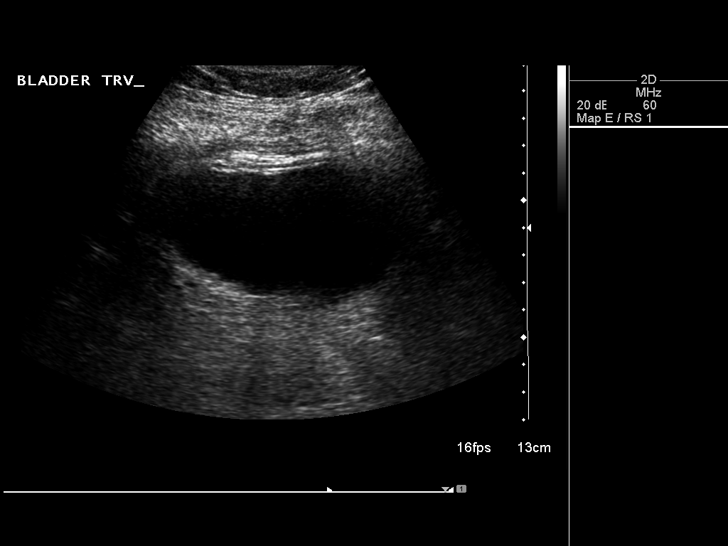
[im 33/33]
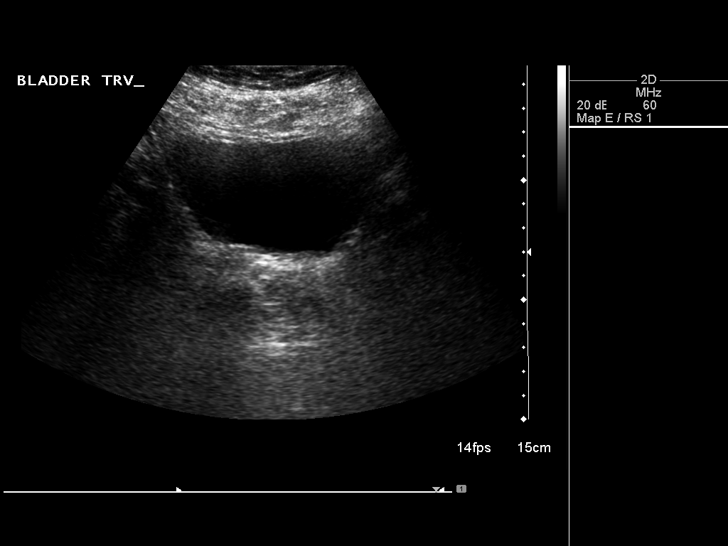

[14 of 25 positions shown; findings below may reference images not displayed]

FINDINGS: Right Kidney:

Length: 11.3 cm. Echogenicity within normal limits. No mass or
hydronephrosis visualized.

Left Kidney:

Length: 12.5 cm. Echogenicity within normal limits. No mass or
hydronephrosis visualized.

Bladder:

Appears normal for degree of bladder distention.
IMPRESSION: Negative exam.

## 2015-11-14 DIAGNOSIS — Z418 Encounter for other procedures for purposes other than remedying health state: Secondary | ICD-10-CM | POA: Diagnosis not present

## 2015-11-14 DIAGNOSIS — D631 Anemia in chronic kidney disease: Secondary | ICD-10-CM | POA: Diagnosis not present

## 2015-11-14 DIAGNOSIS — N186 End stage renal disease: Secondary | ICD-10-CM | POA: Diagnosis not present

## 2015-11-16 DIAGNOSIS — N186 End stage renal disease: Secondary | ICD-10-CM | POA: Diagnosis not present

## 2015-11-16 DIAGNOSIS — Z418 Encounter for other procedures for purposes other than remedying health state: Secondary | ICD-10-CM | POA: Diagnosis not present

## 2015-11-16 DIAGNOSIS — D631 Anemia in chronic kidney disease: Secondary | ICD-10-CM | POA: Diagnosis not present

## 2015-11-18 DIAGNOSIS — T8789 Other complications of amputation stump: Secondary | ICD-10-CM | POA: Diagnosis not present

## 2015-11-18 DIAGNOSIS — Z79899 Other long term (current) drug therapy: Secondary | ICD-10-CM | POA: Diagnosis not present

## 2015-11-18 DIAGNOSIS — Z794 Long term (current) use of insulin: Secondary | ICD-10-CM | POA: Diagnosis not present

## 2015-11-18 DIAGNOSIS — R531 Weakness: Secondary | ICD-10-CM | POA: Diagnosis not present

## 2015-11-18 DIAGNOSIS — E1142 Type 2 diabetes mellitus with diabetic polyneuropathy: Secondary | ICD-10-CM | POA: Diagnosis not present

## 2015-11-18 DIAGNOSIS — Z992 Dependence on renal dialysis: Secondary | ICD-10-CM | POA: Diagnosis not present

## 2015-11-18 DIAGNOSIS — M86671 Other chronic osteomyelitis, right ankle and foot: Secondary | ICD-10-CM | POA: Diagnosis not present

## 2015-11-18 DIAGNOSIS — I252 Old myocardial infarction: Secondary | ICD-10-CM | POA: Diagnosis not present

## 2015-11-18 DIAGNOSIS — L89614 Pressure ulcer of right heel, stage 4: Secondary | ICD-10-CM | POA: Diagnosis not present

## 2015-11-18 DIAGNOSIS — E1122 Type 2 diabetes mellitus with diabetic chronic kidney disease: Secondary | ICD-10-CM | POA: Diagnosis not present

## 2015-11-18 DIAGNOSIS — E872 Acidosis: Secondary | ICD-10-CM | POA: Diagnosis not present

## 2015-11-18 DIAGNOSIS — N186 End stage renal disease: Secondary | ICD-10-CM | POA: Diagnosis not present

## 2015-11-18 DIAGNOSIS — L89150 Pressure ulcer of sacral region, unstageable: Secondary | ICD-10-CM | POA: Diagnosis not present

## 2015-11-18 DIAGNOSIS — I10 Essential (primary) hypertension: Secondary | ICD-10-CM | POA: Diagnosis not present

## 2015-11-18 DIAGNOSIS — I120 Hypertensive chronic kidney disease with stage 5 chronic kidney disease or end stage renal disease: Secondary | ICD-10-CM | POA: Diagnosis not present

## 2015-11-18 DIAGNOSIS — D631 Anemia in chronic kidney disease: Secondary | ICD-10-CM | POA: Diagnosis not present

## 2015-11-18 DIAGNOSIS — I2510 Atherosclerotic heart disease of native coronary artery without angina pectoris: Secondary | ICD-10-CM | POA: Diagnosis not present

## 2015-11-18 DIAGNOSIS — G629 Polyneuropathy, unspecified: Secondary | ICD-10-CM | POA: Diagnosis not present

## 2015-11-18 DIAGNOSIS — Z89511 Acquired absence of right leg below knee: Secondary | ICD-10-CM | POA: Diagnosis not present

## 2015-11-18 DIAGNOSIS — Z7401 Bed confinement status: Secondary | ICD-10-CM | POA: Diagnosis not present

## 2015-11-18 DIAGNOSIS — D500 Iron deficiency anemia secondary to blood loss (chronic): Secondary | ICD-10-CM | POA: Diagnosis not present

## 2015-11-18 DIAGNOSIS — E119 Type 2 diabetes mellitus without complications: Secondary | ICD-10-CM | POA: Diagnosis not present

## 2015-11-18 DIAGNOSIS — R296 Repeated falls: Secondary | ICD-10-CM | POA: Diagnosis not present

## 2015-11-18 DIAGNOSIS — T879 Unspecified complications of amputation stump: Secondary | ICD-10-CM | POA: Diagnosis not present

## 2015-11-18 DIAGNOSIS — M6281 Muscle weakness (generalized): Secondary | ICD-10-CM | POA: Diagnosis not present

## 2015-11-18 DIAGNOSIS — Z4789 Encounter for other orthopedic aftercare: Secondary | ICD-10-CM | POA: Diagnosis not present

## 2015-11-18 DIAGNOSIS — Z9842 Cataract extraction status, left eye: Secondary | ICD-10-CM | POA: Diagnosis not present

## 2015-11-18 DIAGNOSIS — Z9841 Cataract extraction status, right eye: Secondary | ICD-10-CM | POA: Diagnosis not present

## 2015-11-18 DIAGNOSIS — Z451 Encounter for adjustment and management of infusion pump: Secondary | ICD-10-CM | POA: Diagnosis not present

## 2015-11-18 DIAGNOSIS — Z87891 Personal history of nicotine dependence: Secondary | ICD-10-CM | POA: Diagnosis not present

## 2015-11-18 DIAGNOSIS — M25571 Pain in right ankle and joints of right foot: Secondary | ICD-10-CM | POA: Diagnosis not present

## 2015-11-18 DIAGNOSIS — L89154 Pressure ulcer of sacral region, stage 4: Secondary | ICD-10-CM | POA: Diagnosis not present

## 2015-11-18 DIAGNOSIS — Z7982 Long term (current) use of aspirin: Secondary | ICD-10-CM | POA: Diagnosis not present

## 2015-11-18 DIAGNOSIS — R2689 Other abnormalities of gait and mobility: Secondary | ICD-10-CM | POA: Diagnosis not present

## 2015-12-03 DIAGNOSIS — Z992 Dependence on renal dialysis: Secondary | ICD-10-CM | POA: Diagnosis not present

## 2015-12-03 DIAGNOSIS — N186 End stage renal disease: Secondary | ICD-10-CM | POA: Diagnosis not present

## 2015-12-04 DIAGNOSIS — E1122 Type 2 diabetes mellitus with diabetic chronic kidney disease: Secondary | ICD-10-CM | POA: Diagnosis not present

## 2015-12-04 DIAGNOSIS — I2510 Atherosclerotic heart disease of native coronary artery without angina pectoris: Secondary | ICD-10-CM | POA: Diagnosis not present

## 2015-12-04 DIAGNOSIS — T8131XA Disruption of external operation (surgical) wound, not elsewhere classified, initial encounter: Secondary | ICD-10-CM | POA: Diagnosis not present

## 2015-12-04 DIAGNOSIS — R531 Weakness: Secondary | ICD-10-CM | POA: Diagnosis not present

## 2015-12-04 DIAGNOSIS — I1311 Hypertensive heart and chronic kidney disease without heart failure, with stage 5 chronic kidney disease, or end stage renal disease: Secondary | ICD-10-CM | POA: Diagnosis not present

## 2015-12-04 DIAGNOSIS — R2689 Other abnormalities of gait and mobility: Secondary | ICD-10-CM | POA: Diagnosis not present

## 2015-12-04 DIAGNOSIS — Z8673 Personal history of transient ischemic attack (TIA), and cerebral infarction without residual deficits: Secondary | ICD-10-CM | POA: Diagnosis not present

## 2015-12-04 DIAGNOSIS — D509 Iron deficiency anemia, unspecified: Secondary | ICD-10-CM | POA: Diagnosis not present

## 2015-12-04 DIAGNOSIS — Z992 Dependence on renal dialysis: Secondary | ICD-10-CM | POA: Diagnosis not present

## 2015-12-04 DIAGNOSIS — N186 End stage renal disease: Secondary | ICD-10-CM | POA: Diagnosis not present

## 2015-12-04 DIAGNOSIS — R296 Repeated falls: Secondary | ICD-10-CM | POA: Diagnosis not present

## 2015-12-04 DIAGNOSIS — Z418 Encounter for other procedures for purposes other than remedying health state: Secondary | ICD-10-CM | POA: Diagnosis not present

## 2015-12-04 DIAGNOSIS — G629 Polyneuropathy, unspecified: Secondary | ICD-10-CM | POA: Diagnosis not present

## 2015-12-04 DIAGNOSIS — L89153 Pressure ulcer of sacral region, stage 3: Secondary | ICD-10-CM | POA: Diagnosis not present

## 2015-12-04 DIAGNOSIS — Z89511 Acquired absence of right leg below knee: Secondary | ICD-10-CM | POA: Diagnosis not present

## 2015-12-04 DIAGNOSIS — T879 Unspecified complications of amputation stump: Secondary | ICD-10-CM | POA: Diagnosis not present

## 2015-12-04 DIAGNOSIS — E119 Type 2 diabetes mellitus without complications: Secondary | ICD-10-CM | POA: Diagnosis not present

## 2015-12-04 DIAGNOSIS — L89150 Pressure ulcer of sacral region, unstageable: Secondary | ICD-10-CM | POA: Diagnosis not present

## 2015-12-04 DIAGNOSIS — L89614 Pressure ulcer of right heel, stage 4: Secondary | ICD-10-CM | POA: Diagnosis not present

## 2015-12-04 DIAGNOSIS — D631 Anemia in chronic kidney disease: Secondary | ICD-10-CM | POA: Diagnosis not present

## 2015-12-04 DIAGNOSIS — Z7401 Bed confinement status: Secondary | ICD-10-CM | POA: Diagnosis not present

## 2015-12-04 DIAGNOSIS — S37009D Unspecified injury of unspecified kidney, subsequent encounter: Secondary | ICD-10-CM | POA: Diagnosis not present

## 2015-12-04 DIAGNOSIS — E1142 Type 2 diabetes mellitus with diabetic polyneuropathy: Secondary | ICD-10-CM | POA: Diagnosis not present

## 2015-12-04 DIAGNOSIS — Z4789 Encounter for other orthopedic aftercare: Secondary | ICD-10-CM | POA: Diagnosis not present

## 2015-12-04 DIAGNOSIS — Z993 Dependence on wheelchair: Secondary | ICD-10-CM | POA: Diagnosis not present

## 2015-12-04 DIAGNOSIS — E872 Acidosis: Secondary | ICD-10-CM | POA: Diagnosis not present

## 2015-12-04 DIAGNOSIS — Z87891 Personal history of nicotine dependence: Secondary | ICD-10-CM | POA: Diagnosis not present

## 2015-12-04 DIAGNOSIS — M6281 Muscle weakness (generalized): Secondary | ICD-10-CM | POA: Diagnosis not present

## 2015-12-04 DIAGNOSIS — M25571 Pain in right ankle and joints of right foot: Secondary | ICD-10-CM | POA: Diagnosis not present

## 2015-12-04 DIAGNOSIS — Z8614 Personal history of Methicillin resistant Staphylococcus aureus infection: Secondary | ICD-10-CM | POA: Diagnosis not present

## 2015-12-04 DIAGNOSIS — T8249XA Other complication of vascular dialysis catheter, initial encounter: Secondary | ICD-10-CM | POA: Diagnosis not present

## 2015-12-04 DIAGNOSIS — E11622 Type 2 diabetes mellitus with other skin ulcer: Secondary | ICD-10-CM | POA: Diagnosis not present

## 2015-12-04 DIAGNOSIS — Z451 Encounter for adjustment and management of infusion pump: Secondary | ICD-10-CM | POA: Diagnosis not present

## 2015-12-05 DIAGNOSIS — N186 End stage renal disease: Secondary | ICD-10-CM | POA: Diagnosis not present

## 2015-12-05 DIAGNOSIS — D509 Iron deficiency anemia, unspecified: Secondary | ICD-10-CM | POA: Diagnosis not present

## 2015-12-05 DIAGNOSIS — Z418 Encounter for other procedures for purposes other than remedying health state: Secondary | ICD-10-CM | POA: Diagnosis not present

## 2015-12-05 DIAGNOSIS — D631 Anemia in chronic kidney disease: Secondary | ICD-10-CM | POA: Diagnosis not present

## 2015-12-07 DIAGNOSIS — D631 Anemia in chronic kidney disease: Secondary | ICD-10-CM | POA: Diagnosis not present

## 2015-12-07 DIAGNOSIS — N186 End stage renal disease: Secondary | ICD-10-CM | POA: Diagnosis not present

## 2015-12-07 DIAGNOSIS — Z418 Encounter for other procedures for purposes other than remedying health state: Secondary | ICD-10-CM | POA: Diagnosis not present

## 2015-12-07 DIAGNOSIS — D509 Iron deficiency anemia, unspecified: Secondary | ICD-10-CM | POA: Diagnosis not present

## 2015-12-10 DIAGNOSIS — D509 Iron deficiency anemia, unspecified: Secondary | ICD-10-CM | POA: Diagnosis not present

## 2015-12-10 DIAGNOSIS — N186 End stage renal disease: Secondary | ICD-10-CM | POA: Diagnosis not present

## 2015-12-10 DIAGNOSIS — D631 Anemia in chronic kidney disease: Secondary | ICD-10-CM | POA: Diagnosis not present

## 2015-12-10 DIAGNOSIS — Z418 Encounter for other procedures for purposes other than remedying health state: Secondary | ICD-10-CM | POA: Diagnosis not present

## 2015-12-12 DIAGNOSIS — D631 Anemia in chronic kidney disease: Secondary | ICD-10-CM | POA: Diagnosis not present

## 2015-12-12 DIAGNOSIS — D509 Iron deficiency anemia, unspecified: Secondary | ICD-10-CM | POA: Diagnosis not present

## 2015-12-12 DIAGNOSIS — N186 End stage renal disease: Secondary | ICD-10-CM | POA: Diagnosis not present

## 2015-12-12 DIAGNOSIS — Z418 Encounter for other procedures for purposes other than remedying health state: Secondary | ICD-10-CM | POA: Diagnosis not present

## 2015-12-14 DIAGNOSIS — D631 Anemia in chronic kidney disease: Secondary | ICD-10-CM | POA: Diagnosis not present

## 2015-12-14 DIAGNOSIS — N186 End stage renal disease: Secondary | ICD-10-CM | POA: Diagnosis not present

## 2015-12-14 DIAGNOSIS — Z418 Encounter for other procedures for purposes other than remedying health state: Secondary | ICD-10-CM | POA: Diagnosis not present

## 2015-12-14 DIAGNOSIS — T8249XA Other complication of vascular dialysis catheter, initial encounter: Secondary | ICD-10-CM | POA: Diagnosis not present

## 2015-12-17 DIAGNOSIS — N186 End stage renal disease: Secondary | ICD-10-CM | POA: Diagnosis not present

## 2015-12-17 DIAGNOSIS — D631 Anemia in chronic kidney disease: Secondary | ICD-10-CM | POA: Diagnosis not present

## 2015-12-17 DIAGNOSIS — T8249XA Other complication of vascular dialysis catheter, initial encounter: Secondary | ICD-10-CM | POA: Diagnosis not present

## 2015-12-17 DIAGNOSIS — Z418 Encounter for other procedures for purposes other than remedying health state: Secondary | ICD-10-CM | POA: Diagnosis not present

## 2015-12-19 DIAGNOSIS — D631 Anemia in chronic kidney disease: Secondary | ICD-10-CM | POA: Diagnosis not present

## 2015-12-19 DIAGNOSIS — Z418 Encounter for other procedures for purposes other than remedying health state: Secondary | ICD-10-CM | POA: Diagnosis not present

## 2015-12-19 DIAGNOSIS — N186 End stage renal disease: Secondary | ICD-10-CM | POA: Diagnosis not present

## 2015-12-19 DIAGNOSIS — T8249XA Other complication of vascular dialysis catheter, initial encounter: Secondary | ICD-10-CM | POA: Diagnosis not present

## 2015-12-21 DIAGNOSIS — D631 Anemia in chronic kidney disease: Secondary | ICD-10-CM | POA: Diagnosis not present

## 2015-12-21 DIAGNOSIS — Z418 Encounter for other procedures for purposes other than remedying health state: Secondary | ICD-10-CM | POA: Diagnosis not present

## 2015-12-21 DIAGNOSIS — N186 End stage renal disease: Secondary | ICD-10-CM | POA: Diagnosis not present

## 2015-12-21 DIAGNOSIS — T8249XA Other complication of vascular dialysis catheter, initial encounter: Secondary | ICD-10-CM | POA: Diagnosis not present

## 2015-12-23 DIAGNOSIS — Z8673 Personal history of transient ischemic attack (TIA), and cerebral infarction without residual deficits: Secondary | ICD-10-CM | POA: Diagnosis not present

## 2015-12-23 DIAGNOSIS — Z87891 Personal history of nicotine dependence: Secondary | ICD-10-CM | POA: Diagnosis not present

## 2015-12-23 DIAGNOSIS — Z8614 Personal history of Methicillin resistant Staphylococcus aureus infection: Secondary | ICD-10-CM | POA: Diagnosis not present

## 2015-12-23 DIAGNOSIS — E1122 Type 2 diabetes mellitus with diabetic chronic kidney disease: Secondary | ICD-10-CM | POA: Diagnosis not present

## 2015-12-23 DIAGNOSIS — I2510 Atherosclerotic heart disease of native coronary artery without angina pectoris: Secondary | ICD-10-CM | POA: Diagnosis not present

## 2015-12-23 DIAGNOSIS — N186 End stage renal disease: Secondary | ICD-10-CM | POA: Diagnosis not present

## 2015-12-23 DIAGNOSIS — I1311 Hypertensive heart and chronic kidney disease without heart failure, with stage 5 chronic kidney disease, or end stage renal disease: Secondary | ICD-10-CM | POA: Diagnosis not present

## 2015-12-23 DIAGNOSIS — S37009D Unspecified injury of unspecified kidney, subsequent encounter: Secondary | ICD-10-CM | POA: Diagnosis not present

## 2015-12-23 DIAGNOSIS — Z993 Dependence on wheelchair: Secondary | ICD-10-CM | POA: Diagnosis not present

## 2015-12-23 DIAGNOSIS — T8131XA Disruption of external operation (surgical) wound, not elsewhere classified, initial encounter: Secondary | ICD-10-CM | POA: Diagnosis not present

## 2015-12-23 DIAGNOSIS — E11622 Type 2 diabetes mellitus with other skin ulcer: Secondary | ICD-10-CM | POA: Diagnosis not present

## 2015-12-23 DIAGNOSIS — L89153 Pressure ulcer of sacral region, stage 3: Secondary | ICD-10-CM | POA: Diagnosis not present

## 2015-12-23 DIAGNOSIS — Z992 Dependence on renal dialysis: Secondary | ICD-10-CM | POA: Diagnosis not present

## 2015-12-24 DIAGNOSIS — Z418 Encounter for other procedures for purposes other than remedying health state: Secondary | ICD-10-CM | POA: Diagnosis not present

## 2015-12-24 DIAGNOSIS — N186 End stage renal disease: Secondary | ICD-10-CM | POA: Diagnosis not present

## 2015-12-24 DIAGNOSIS — D631 Anemia in chronic kidney disease: Secondary | ICD-10-CM | POA: Diagnosis not present

## 2015-12-26 DIAGNOSIS — D631 Anemia in chronic kidney disease: Secondary | ICD-10-CM | POA: Diagnosis not present

## 2015-12-26 DIAGNOSIS — Z418 Encounter for other procedures for purposes other than remedying health state: Secondary | ICD-10-CM | POA: Diagnosis not present

## 2015-12-26 DIAGNOSIS — N186 End stage renal disease: Secondary | ICD-10-CM | POA: Diagnosis not present

## 2015-12-28 DIAGNOSIS — N186 End stage renal disease: Secondary | ICD-10-CM | POA: Diagnosis not present

## 2015-12-28 DIAGNOSIS — Z418 Encounter for other procedures for purposes other than remedying health state: Secondary | ICD-10-CM | POA: Diagnosis not present

## 2015-12-28 DIAGNOSIS — D631 Anemia in chronic kidney disease: Secondary | ICD-10-CM | POA: Diagnosis not present

## 2015-12-30 DIAGNOSIS — L89153 Pressure ulcer of sacral region, stage 3: Secondary | ICD-10-CM | POA: Diagnosis not present

## 2015-12-30 DIAGNOSIS — I1311 Hypertensive heart and chronic kidney disease without heart failure, with stage 5 chronic kidney disease, or end stage renal disease: Secondary | ICD-10-CM | POA: Diagnosis not present

## 2015-12-30 DIAGNOSIS — Z992 Dependence on renal dialysis: Secondary | ICD-10-CM | POA: Diagnosis not present

## 2015-12-30 DIAGNOSIS — Z993 Dependence on wheelchair: Secondary | ICD-10-CM | POA: Diagnosis not present

## 2015-12-30 DIAGNOSIS — E1122 Type 2 diabetes mellitus with diabetic chronic kidney disease: Secondary | ICD-10-CM | POA: Diagnosis not present

## 2015-12-30 DIAGNOSIS — N186 End stage renal disease: Secondary | ICD-10-CM | POA: Diagnosis not present

## 2015-12-30 DIAGNOSIS — S37009D Unspecified injury of unspecified kidney, subsequent encounter: Secondary | ICD-10-CM | POA: Diagnosis not present

## 2015-12-30 DIAGNOSIS — I2510 Atherosclerotic heart disease of native coronary artery without angina pectoris: Secondary | ICD-10-CM | POA: Diagnosis not present

## 2015-12-30 DIAGNOSIS — Z8614 Personal history of Methicillin resistant Staphylococcus aureus infection: Secondary | ICD-10-CM | POA: Diagnosis not present

## 2015-12-30 DIAGNOSIS — Z8673 Personal history of transient ischemic attack (TIA), and cerebral infarction without residual deficits: Secondary | ICD-10-CM | POA: Diagnosis not present

## 2015-12-30 DIAGNOSIS — Z87891 Personal history of nicotine dependence: Secondary | ICD-10-CM | POA: Diagnosis not present

## 2015-12-30 DIAGNOSIS — T8131XA Disruption of external operation (surgical) wound, not elsewhere classified, initial encounter: Secondary | ICD-10-CM | POA: Diagnosis not present

## 2015-12-30 DIAGNOSIS — E11622 Type 2 diabetes mellitus with other skin ulcer: Secondary | ICD-10-CM | POA: Diagnosis not present

## 2015-12-31 DIAGNOSIS — Z992 Dependence on renal dialysis: Secondary | ICD-10-CM | POA: Diagnosis not present

## 2015-12-31 DIAGNOSIS — D631 Anemia in chronic kidney disease: Secondary | ICD-10-CM | POA: Diagnosis not present

## 2015-12-31 DIAGNOSIS — N186 End stage renal disease: Secondary | ICD-10-CM | POA: Diagnosis not present

## 2015-12-31 DIAGNOSIS — Z418 Encounter for other procedures for purposes other than remedying health state: Secondary | ICD-10-CM | POA: Diagnosis not present

## 2016-01-02 DIAGNOSIS — N2581 Secondary hyperparathyroidism of renal origin: Secondary | ICD-10-CM | POA: Diagnosis not present

## 2016-01-02 DIAGNOSIS — Z23 Encounter for immunization: Secondary | ICD-10-CM | POA: Diagnosis not present

## 2016-01-02 DIAGNOSIS — Z418 Encounter for other procedures for purposes other than remedying health state: Secondary | ICD-10-CM | POA: Diagnosis not present

## 2016-01-02 DIAGNOSIS — D509 Iron deficiency anemia, unspecified: Secondary | ICD-10-CM | POA: Diagnosis not present

## 2016-01-02 DIAGNOSIS — E119 Type 2 diabetes mellitus without complications: Secondary | ICD-10-CM | POA: Diagnosis not present

## 2016-01-02 DIAGNOSIS — N186 End stage renal disease: Secondary | ICD-10-CM | POA: Diagnosis not present

## 2016-01-02 DIAGNOSIS — D631 Anemia in chronic kidney disease: Secondary | ICD-10-CM | POA: Diagnosis not present

## 2016-01-04 DIAGNOSIS — Z418 Encounter for other procedures for purposes other than remedying health state: Secondary | ICD-10-CM | POA: Diagnosis not present

## 2016-01-04 DIAGNOSIS — D509 Iron deficiency anemia, unspecified: Secondary | ICD-10-CM | POA: Diagnosis not present

## 2016-01-04 DIAGNOSIS — N186 End stage renal disease: Secondary | ICD-10-CM | POA: Diagnosis not present

## 2016-01-04 DIAGNOSIS — Z23 Encounter for immunization: Secondary | ICD-10-CM | POA: Diagnosis not present

## 2016-01-04 DIAGNOSIS — D631 Anemia in chronic kidney disease: Secondary | ICD-10-CM | POA: Diagnosis not present

## 2016-01-04 DIAGNOSIS — N2581 Secondary hyperparathyroidism of renal origin: Secondary | ICD-10-CM | POA: Diagnosis not present

## 2016-01-07 DIAGNOSIS — Z23 Encounter for immunization: Secondary | ICD-10-CM | POA: Diagnosis not present

## 2016-01-07 DIAGNOSIS — D631 Anemia in chronic kidney disease: Secondary | ICD-10-CM | POA: Diagnosis not present

## 2016-01-07 DIAGNOSIS — D509 Iron deficiency anemia, unspecified: Secondary | ICD-10-CM | POA: Diagnosis not present

## 2016-01-07 DIAGNOSIS — N2581 Secondary hyperparathyroidism of renal origin: Secondary | ICD-10-CM | POA: Diagnosis not present

## 2016-01-07 DIAGNOSIS — Z418 Encounter for other procedures for purposes other than remedying health state: Secondary | ICD-10-CM | POA: Diagnosis not present

## 2016-01-07 DIAGNOSIS — N186 End stage renal disease: Secondary | ICD-10-CM | POA: Diagnosis not present

## 2016-01-09 DIAGNOSIS — Z23 Encounter for immunization: Secondary | ICD-10-CM | POA: Diagnosis not present

## 2016-01-09 DIAGNOSIS — D509 Iron deficiency anemia, unspecified: Secondary | ICD-10-CM | POA: Diagnosis not present

## 2016-01-09 DIAGNOSIS — D631 Anemia in chronic kidney disease: Secondary | ICD-10-CM | POA: Diagnosis not present

## 2016-01-09 DIAGNOSIS — Z418 Encounter for other procedures for purposes other than remedying health state: Secondary | ICD-10-CM | POA: Diagnosis not present

## 2016-01-09 DIAGNOSIS — N2581 Secondary hyperparathyroidism of renal origin: Secondary | ICD-10-CM | POA: Diagnosis not present

## 2016-01-09 DIAGNOSIS — N186 End stage renal disease: Secondary | ICD-10-CM | POA: Diagnosis not present

## 2016-01-11 DIAGNOSIS — D631 Anemia in chronic kidney disease: Secondary | ICD-10-CM | POA: Diagnosis not present

## 2016-01-11 DIAGNOSIS — Z418 Encounter for other procedures for purposes other than remedying health state: Secondary | ICD-10-CM | POA: Diagnosis not present

## 2016-01-11 DIAGNOSIS — D509 Iron deficiency anemia, unspecified: Secondary | ICD-10-CM | POA: Diagnosis not present

## 2016-01-11 DIAGNOSIS — N186 End stage renal disease: Secondary | ICD-10-CM | POA: Diagnosis not present

## 2016-01-13 DIAGNOSIS — I1311 Hypertensive heart and chronic kidney disease without heart failure, with stage 5 chronic kidney disease, or end stage renal disease: Secondary | ICD-10-CM | POA: Diagnosis not present

## 2016-01-13 DIAGNOSIS — S37009D Unspecified injury of unspecified kidney, subsequent encounter: Secondary | ICD-10-CM | POA: Diagnosis not present

## 2016-01-13 DIAGNOSIS — Z87891 Personal history of nicotine dependence: Secondary | ICD-10-CM | POA: Diagnosis not present

## 2016-01-13 DIAGNOSIS — L89154 Pressure ulcer of sacral region, stage 4: Secondary | ICD-10-CM | POA: Diagnosis not present

## 2016-01-13 DIAGNOSIS — Z8673 Personal history of transient ischemic attack (TIA), and cerebral infarction without residual deficits: Secondary | ICD-10-CM | POA: Diagnosis not present

## 2016-01-13 DIAGNOSIS — E1122 Type 2 diabetes mellitus with diabetic chronic kidney disease: Secondary | ICD-10-CM | POA: Diagnosis not present

## 2016-01-13 DIAGNOSIS — L89153 Pressure ulcer of sacral region, stage 3: Secondary | ICD-10-CM | POA: Diagnosis not present

## 2016-01-13 DIAGNOSIS — Z993 Dependence on wheelchair: Secondary | ICD-10-CM | POA: Diagnosis not present

## 2016-01-13 DIAGNOSIS — E11622 Type 2 diabetes mellitus with other skin ulcer: Secondary | ICD-10-CM | POA: Diagnosis not present

## 2016-01-13 DIAGNOSIS — Z992 Dependence on renal dialysis: Secondary | ICD-10-CM | POA: Diagnosis not present

## 2016-01-13 DIAGNOSIS — I2510 Atherosclerotic heart disease of native coronary artery without angina pectoris: Secondary | ICD-10-CM | POA: Diagnosis not present

## 2016-01-13 DIAGNOSIS — N186 End stage renal disease: Secondary | ICD-10-CM | POA: Diagnosis not present

## 2016-01-13 DIAGNOSIS — T8131XA Disruption of external operation (surgical) wound, not elsewhere classified, initial encounter: Secondary | ICD-10-CM | POA: Diagnosis not present

## 2016-01-13 DIAGNOSIS — Z8614 Personal history of Methicillin resistant Staphylococcus aureus infection: Secondary | ICD-10-CM | POA: Diagnosis not present

## 2016-01-14 DIAGNOSIS — N186 End stage renal disease: Secondary | ICD-10-CM | POA: Diagnosis not present

## 2016-01-14 DIAGNOSIS — Z418 Encounter for other procedures for purposes other than remedying health state: Secondary | ICD-10-CM | POA: Diagnosis not present

## 2016-01-14 DIAGNOSIS — D631 Anemia in chronic kidney disease: Secondary | ICD-10-CM | POA: Diagnosis not present

## 2016-01-14 DIAGNOSIS — D509 Iron deficiency anemia, unspecified: Secondary | ICD-10-CM | POA: Diagnosis not present

## 2016-01-16 DIAGNOSIS — N186 End stage renal disease: Secondary | ICD-10-CM | POA: Diagnosis not present

## 2016-01-16 DIAGNOSIS — D509 Iron deficiency anemia, unspecified: Secondary | ICD-10-CM | POA: Diagnosis not present

## 2016-01-16 DIAGNOSIS — Z418 Encounter for other procedures for purposes other than remedying health state: Secondary | ICD-10-CM | POA: Diagnosis not present

## 2016-01-16 DIAGNOSIS — D631 Anemia in chronic kidney disease: Secondary | ICD-10-CM | POA: Diagnosis not present

## 2016-01-18 DIAGNOSIS — D631 Anemia in chronic kidney disease: Secondary | ICD-10-CM | POA: Diagnosis not present

## 2016-01-18 DIAGNOSIS — D509 Iron deficiency anemia, unspecified: Secondary | ICD-10-CM | POA: Diagnosis not present

## 2016-01-18 DIAGNOSIS — Z418 Encounter for other procedures for purposes other than remedying health state: Secondary | ICD-10-CM | POA: Diagnosis not present

## 2016-01-18 DIAGNOSIS — N186 End stage renal disease: Secondary | ICD-10-CM | POA: Diagnosis not present

## 2016-01-20 DIAGNOSIS — I1311 Hypertensive heart and chronic kidney disease without heart failure, with stage 5 chronic kidney disease, or end stage renal disease: Secondary | ICD-10-CM | POA: Diagnosis not present

## 2016-01-20 DIAGNOSIS — S37009D Unspecified injury of unspecified kidney, subsequent encounter: Secondary | ICD-10-CM | POA: Diagnosis not present

## 2016-01-20 DIAGNOSIS — N186 End stage renal disease: Secondary | ICD-10-CM | POA: Diagnosis not present

## 2016-01-20 DIAGNOSIS — L89153 Pressure ulcer of sacral region, stage 3: Secondary | ICD-10-CM | POA: Diagnosis not present

## 2016-01-20 DIAGNOSIS — E1122 Type 2 diabetes mellitus with diabetic chronic kidney disease: Secondary | ICD-10-CM | POA: Diagnosis not present

## 2016-01-20 DIAGNOSIS — I2510 Atherosclerotic heart disease of native coronary artery without angina pectoris: Secondary | ICD-10-CM | POA: Diagnosis not present

## 2016-01-20 DIAGNOSIS — Z992 Dependence on renal dialysis: Secondary | ICD-10-CM | POA: Diagnosis not present

## 2016-01-20 DIAGNOSIS — Z8673 Personal history of transient ischemic attack (TIA), and cerebral infarction without residual deficits: Secondary | ICD-10-CM | POA: Diagnosis not present

## 2016-01-20 DIAGNOSIS — E11622 Type 2 diabetes mellitus with other skin ulcer: Secondary | ICD-10-CM | POA: Diagnosis not present

## 2016-01-20 DIAGNOSIS — Z993 Dependence on wheelchair: Secondary | ICD-10-CM | POA: Diagnosis not present

## 2016-01-20 DIAGNOSIS — Z8614 Personal history of Methicillin resistant Staphylococcus aureus infection: Secondary | ICD-10-CM | POA: Diagnosis not present

## 2016-01-20 DIAGNOSIS — L89154 Pressure ulcer of sacral region, stage 4: Secondary | ICD-10-CM | POA: Diagnosis not present

## 2016-01-20 DIAGNOSIS — Z87891 Personal history of nicotine dependence: Secondary | ICD-10-CM | POA: Diagnosis not present

## 2016-01-20 DIAGNOSIS — T8131XA Disruption of external operation (surgical) wound, not elsewhere classified, initial encounter: Secondary | ICD-10-CM | POA: Diagnosis not present

## 2016-01-21 DIAGNOSIS — D631 Anemia in chronic kidney disease: Secondary | ICD-10-CM | POA: Diagnosis not present

## 2016-01-21 DIAGNOSIS — Z418 Encounter for other procedures for purposes other than remedying health state: Secondary | ICD-10-CM | POA: Diagnosis not present

## 2016-01-21 DIAGNOSIS — N186 End stage renal disease: Secondary | ICD-10-CM | POA: Diagnosis not present

## 2016-01-23 DIAGNOSIS — D631 Anemia in chronic kidney disease: Secondary | ICD-10-CM | POA: Diagnosis not present

## 2016-01-23 DIAGNOSIS — Z418 Encounter for other procedures for purposes other than remedying health state: Secondary | ICD-10-CM | POA: Diagnosis not present

## 2016-01-23 DIAGNOSIS — N186 End stage renal disease: Secondary | ICD-10-CM | POA: Diagnosis not present

## 2016-01-25 DIAGNOSIS — D631 Anemia in chronic kidney disease: Secondary | ICD-10-CM | POA: Diagnosis not present

## 2016-01-25 DIAGNOSIS — Z418 Encounter for other procedures for purposes other than remedying health state: Secondary | ICD-10-CM | POA: Diagnosis not present

## 2016-01-25 DIAGNOSIS — N186 End stage renal disease: Secondary | ICD-10-CM | POA: Diagnosis not present

## 2016-01-27 DIAGNOSIS — L89154 Pressure ulcer of sacral region, stage 4: Secondary | ICD-10-CM | POA: Diagnosis not present

## 2016-01-27 DIAGNOSIS — N186 End stage renal disease: Secondary | ICD-10-CM | POA: Diagnosis not present

## 2016-01-28 DIAGNOSIS — D631 Anemia in chronic kidney disease: Secondary | ICD-10-CM | POA: Diagnosis not present

## 2016-01-28 DIAGNOSIS — Z418 Encounter for other procedures for purposes other than remedying health state: Secondary | ICD-10-CM | POA: Diagnosis not present

## 2016-01-28 DIAGNOSIS — N186 End stage renal disease: Secondary | ICD-10-CM | POA: Diagnosis not present

## 2016-01-30 DIAGNOSIS — Z418 Encounter for other procedures for purposes other than remedying health state: Secondary | ICD-10-CM | POA: Diagnosis not present

## 2016-01-30 DIAGNOSIS — D631 Anemia in chronic kidney disease: Secondary | ICD-10-CM | POA: Diagnosis not present

## 2016-01-30 DIAGNOSIS — N186 End stage renal disease: Secondary | ICD-10-CM | POA: Diagnosis not present

## 2016-01-31 DIAGNOSIS — Z992 Dependence on renal dialysis: Secondary | ICD-10-CM | POA: Diagnosis not present

## 2016-01-31 DIAGNOSIS — N186 End stage renal disease: Secondary | ICD-10-CM | POA: Diagnosis not present

## 2016-02-01 DIAGNOSIS — Z418 Encounter for other procedures for purposes other than remedying health state: Secondary | ICD-10-CM | POA: Diagnosis not present

## 2016-02-01 DIAGNOSIS — D509 Iron deficiency anemia, unspecified: Secondary | ICD-10-CM | POA: Diagnosis not present

## 2016-02-01 DIAGNOSIS — D631 Anemia in chronic kidney disease: Secondary | ICD-10-CM | POA: Diagnosis not present

## 2016-02-01 DIAGNOSIS — N2581 Secondary hyperparathyroidism of renal origin: Secondary | ICD-10-CM | POA: Diagnosis not present

## 2016-02-01 DIAGNOSIS — N186 End stage renal disease: Secondary | ICD-10-CM | POA: Diagnosis not present

## 2016-02-03 DIAGNOSIS — M6281 Muscle weakness (generalized): Secondary | ICD-10-CM | POA: Diagnosis not present

## 2016-02-03 DIAGNOSIS — I252 Old myocardial infarction: Secondary | ICD-10-CM | POA: Diagnosis not present

## 2016-02-03 DIAGNOSIS — Z992 Dependence on renal dialysis: Secondary | ICD-10-CM | POA: Diagnosis not present

## 2016-02-03 DIAGNOSIS — Z87891 Personal history of nicotine dependence: Secondary | ICD-10-CM | POA: Diagnosis not present

## 2016-02-03 DIAGNOSIS — L89154 Pressure ulcer of sacral region, stage 4: Secondary | ICD-10-CM | POA: Diagnosis not present

## 2016-02-03 DIAGNOSIS — I120 Hypertensive chronic kidney disease with stage 5 chronic kidney disease or end stage renal disease: Secondary | ICD-10-CM | POA: Diagnosis not present

## 2016-02-03 DIAGNOSIS — T879 Unspecified complications of amputation stump: Secondary | ICD-10-CM | POA: Diagnosis not present

## 2016-02-03 DIAGNOSIS — E11622 Type 2 diabetes mellitus with other skin ulcer: Secondary | ICD-10-CM | POA: Diagnosis not present

## 2016-02-03 DIAGNOSIS — Z993 Dependence on wheelchair: Secondary | ICD-10-CM | POA: Diagnosis not present

## 2016-02-03 DIAGNOSIS — E1122 Type 2 diabetes mellitus with diabetic chronic kidney disease: Secondary | ICD-10-CM | POA: Diagnosis not present

## 2016-02-03 DIAGNOSIS — L89153 Pressure ulcer of sacral region, stage 3: Secondary | ICD-10-CM | POA: Diagnosis not present

## 2016-02-03 DIAGNOSIS — N186 End stage renal disease: Secondary | ICD-10-CM | POA: Diagnosis not present

## 2016-02-03 DIAGNOSIS — I2510 Atherosclerotic heart disease of native coronary artery without angina pectoris: Secondary | ICD-10-CM | POA: Diagnosis not present

## 2016-02-03 DIAGNOSIS — Z8673 Personal history of transient ischemic attack (TIA), and cerebral infarction without residual deficits: Secondary | ICD-10-CM | POA: Diagnosis not present

## 2016-02-04 DIAGNOSIS — N186 End stage renal disease: Secondary | ICD-10-CM | POA: Diagnosis not present

## 2016-02-04 DIAGNOSIS — Z418 Encounter for other procedures for purposes other than remedying health state: Secondary | ICD-10-CM | POA: Diagnosis not present

## 2016-02-04 DIAGNOSIS — N2581 Secondary hyperparathyroidism of renal origin: Secondary | ICD-10-CM | POA: Diagnosis not present

## 2016-02-04 DIAGNOSIS — D631 Anemia in chronic kidney disease: Secondary | ICD-10-CM | POA: Diagnosis not present

## 2016-02-04 DIAGNOSIS — D509 Iron deficiency anemia, unspecified: Secondary | ICD-10-CM | POA: Diagnosis not present

## 2016-02-06 DIAGNOSIS — Z418 Encounter for other procedures for purposes other than remedying health state: Secondary | ICD-10-CM | POA: Diagnosis not present

## 2016-02-06 DIAGNOSIS — N2581 Secondary hyperparathyroidism of renal origin: Secondary | ICD-10-CM | POA: Diagnosis not present

## 2016-02-06 DIAGNOSIS — D631 Anemia in chronic kidney disease: Secondary | ICD-10-CM | POA: Diagnosis not present

## 2016-02-06 DIAGNOSIS — N186 End stage renal disease: Secondary | ICD-10-CM | POA: Diagnosis not present

## 2016-02-06 DIAGNOSIS — D509 Iron deficiency anemia, unspecified: Secondary | ICD-10-CM | POA: Diagnosis not present

## 2016-02-06 DIAGNOSIS — E119 Type 2 diabetes mellitus without complications: Secondary | ICD-10-CM | POA: Diagnosis not present

## 2016-02-08 DIAGNOSIS — Z418 Encounter for other procedures for purposes other than remedying health state: Secondary | ICD-10-CM | POA: Diagnosis not present

## 2016-02-08 DIAGNOSIS — D509 Iron deficiency anemia, unspecified: Secondary | ICD-10-CM | POA: Diagnosis not present

## 2016-02-08 DIAGNOSIS — N186 End stage renal disease: Secondary | ICD-10-CM | POA: Diagnosis not present

## 2016-02-08 DIAGNOSIS — D631 Anemia in chronic kidney disease: Secondary | ICD-10-CM | POA: Diagnosis not present

## 2016-02-08 DIAGNOSIS — N2581 Secondary hyperparathyroidism of renal origin: Secondary | ICD-10-CM | POA: Diagnosis not present

## 2016-02-10 DIAGNOSIS — L89154 Pressure ulcer of sacral region, stage 4: Secondary | ICD-10-CM | POA: Diagnosis not present

## 2016-02-10 DIAGNOSIS — Z993 Dependence on wheelchair: Secondary | ICD-10-CM | POA: Diagnosis not present

## 2016-02-10 DIAGNOSIS — I252 Old myocardial infarction: Secondary | ICD-10-CM | POA: Diagnosis not present

## 2016-02-10 DIAGNOSIS — I120 Hypertensive chronic kidney disease with stage 5 chronic kidney disease or end stage renal disease: Secondary | ICD-10-CM | POA: Diagnosis not present

## 2016-02-10 DIAGNOSIS — Z8673 Personal history of transient ischemic attack (TIA), and cerebral infarction without residual deficits: Secondary | ICD-10-CM | POA: Diagnosis not present

## 2016-02-10 DIAGNOSIS — Z992 Dependence on renal dialysis: Secondary | ICD-10-CM | POA: Diagnosis not present

## 2016-02-10 DIAGNOSIS — Z87891 Personal history of nicotine dependence: Secondary | ICD-10-CM | POA: Diagnosis not present

## 2016-02-10 DIAGNOSIS — N186 End stage renal disease: Secondary | ICD-10-CM | POA: Diagnosis not present

## 2016-02-10 DIAGNOSIS — I2510 Atherosclerotic heart disease of native coronary artery without angina pectoris: Secondary | ICD-10-CM | POA: Diagnosis not present

## 2016-02-10 DIAGNOSIS — E11622 Type 2 diabetes mellitus with other skin ulcer: Secondary | ICD-10-CM | POA: Diagnosis not present

## 2016-02-10 DIAGNOSIS — E1122 Type 2 diabetes mellitus with diabetic chronic kidney disease: Secondary | ICD-10-CM | POA: Diagnosis not present

## 2016-02-11 DIAGNOSIS — Z418 Encounter for other procedures for purposes other than remedying health state: Secondary | ICD-10-CM | POA: Diagnosis not present

## 2016-02-11 DIAGNOSIS — N186 End stage renal disease: Secondary | ICD-10-CM | POA: Diagnosis not present

## 2016-02-11 DIAGNOSIS — D631 Anemia in chronic kidney disease: Secondary | ICD-10-CM | POA: Diagnosis not present

## 2016-02-11 DIAGNOSIS — D509 Iron deficiency anemia, unspecified: Secondary | ICD-10-CM | POA: Diagnosis not present

## 2016-02-13 DIAGNOSIS — D509 Iron deficiency anemia, unspecified: Secondary | ICD-10-CM | POA: Diagnosis not present

## 2016-02-13 DIAGNOSIS — N186 End stage renal disease: Secondary | ICD-10-CM | POA: Diagnosis not present

## 2016-02-13 DIAGNOSIS — D631 Anemia in chronic kidney disease: Secondary | ICD-10-CM | POA: Diagnosis not present

## 2016-02-13 DIAGNOSIS — Z418 Encounter for other procedures for purposes other than remedying health state: Secondary | ICD-10-CM | POA: Diagnosis not present

## 2016-02-15 DIAGNOSIS — Z418 Encounter for other procedures for purposes other than remedying health state: Secondary | ICD-10-CM | POA: Diagnosis not present

## 2016-02-15 DIAGNOSIS — D509 Iron deficiency anemia, unspecified: Secondary | ICD-10-CM | POA: Diagnosis not present

## 2016-02-15 DIAGNOSIS — N186 End stage renal disease: Secondary | ICD-10-CM | POA: Diagnosis not present

## 2016-02-15 DIAGNOSIS — D631 Anemia in chronic kidney disease: Secondary | ICD-10-CM | POA: Diagnosis not present

## 2016-02-18 DIAGNOSIS — N186 End stage renal disease: Secondary | ICD-10-CM | POA: Diagnosis not present

## 2016-02-18 DIAGNOSIS — D509 Iron deficiency anemia, unspecified: Secondary | ICD-10-CM | POA: Diagnosis not present

## 2016-02-18 DIAGNOSIS — Z418 Encounter for other procedures for purposes other than remedying health state: Secondary | ICD-10-CM | POA: Diagnosis not present

## 2016-02-18 DIAGNOSIS — D631 Anemia in chronic kidney disease: Secondary | ICD-10-CM | POA: Diagnosis not present

## 2016-02-20 DIAGNOSIS — D509 Iron deficiency anemia, unspecified: Secondary | ICD-10-CM | POA: Diagnosis not present

## 2016-02-20 DIAGNOSIS — D631 Anemia in chronic kidney disease: Secondary | ICD-10-CM | POA: Diagnosis not present

## 2016-02-20 DIAGNOSIS — Z418 Encounter for other procedures for purposes other than remedying health state: Secondary | ICD-10-CM | POA: Diagnosis not present

## 2016-02-20 DIAGNOSIS — N186 End stage renal disease: Secondary | ICD-10-CM | POA: Diagnosis not present

## 2016-02-22 DIAGNOSIS — N186 End stage renal disease: Secondary | ICD-10-CM | POA: Diagnosis not present

## 2016-02-22 DIAGNOSIS — D631 Anemia in chronic kidney disease: Secondary | ICD-10-CM | POA: Diagnosis not present

## 2016-02-22 DIAGNOSIS — T8249XA Other complication of vascular dialysis catheter, initial encounter: Secondary | ICD-10-CM | POA: Diagnosis not present

## 2016-02-22 DIAGNOSIS — Z418 Encounter for other procedures for purposes other than remedying health state: Secondary | ICD-10-CM | POA: Diagnosis not present

## 2016-02-24 DIAGNOSIS — Z993 Dependence on wheelchair: Secondary | ICD-10-CM | POA: Diagnosis not present

## 2016-02-24 DIAGNOSIS — E1122 Type 2 diabetes mellitus with diabetic chronic kidney disease: Secondary | ICD-10-CM | POA: Diagnosis not present

## 2016-02-24 DIAGNOSIS — L89154 Pressure ulcer of sacral region, stage 4: Secondary | ICD-10-CM | POA: Diagnosis not present

## 2016-02-24 DIAGNOSIS — Z8673 Personal history of transient ischemic attack (TIA), and cerebral infarction without residual deficits: Secondary | ICD-10-CM | POA: Diagnosis not present

## 2016-02-24 DIAGNOSIS — I120 Hypertensive chronic kidney disease with stage 5 chronic kidney disease or end stage renal disease: Secondary | ICD-10-CM | POA: Diagnosis not present

## 2016-02-24 DIAGNOSIS — E11622 Type 2 diabetes mellitus with other skin ulcer: Secondary | ICD-10-CM | POA: Diagnosis not present

## 2016-02-24 DIAGNOSIS — I252 Old myocardial infarction: Secondary | ICD-10-CM | POA: Diagnosis not present

## 2016-02-24 DIAGNOSIS — N186 End stage renal disease: Secondary | ICD-10-CM | POA: Diagnosis not present

## 2016-02-24 DIAGNOSIS — I2510 Atherosclerotic heart disease of native coronary artery without angina pectoris: Secondary | ICD-10-CM | POA: Diagnosis not present

## 2016-02-24 DIAGNOSIS — Z87891 Personal history of nicotine dependence: Secondary | ICD-10-CM | POA: Diagnosis not present

## 2016-02-24 DIAGNOSIS — Z992 Dependence on renal dialysis: Secondary | ICD-10-CM | POA: Diagnosis not present

## 2016-02-27 DIAGNOSIS — D631 Anemia in chronic kidney disease: Secondary | ICD-10-CM | POA: Diagnosis not present

## 2016-02-27 DIAGNOSIS — T8249XA Other complication of vascular dialysis catheter, initial encounter: Secondary | ICD-10-CM | POA: Diagnosis not present

## 2016-02-27 DIAGNOSIS — N186 End stage renal disease: Secondary | ICD-10-CM | POA: Diagnosis not present

## 2016-02-27 DIAGNOSIS — Z418 Encounter for other procedures for purposes other than remedying health state: Secondary | ICD-10-CM | POA: Diagnosis not present

## 2016-02-29 DIAGNOSIS — D631 Anemia in chronic kidney disease: Secondary | ICD-10-CM | POA: Diagnosis not present

## 2016-02-29 DIAGNOSIS — N186 End stage renal disease: Secondary | ICD-10-CM | POA: Diagnosis not present

## 2016-02-29 DIAGNOSIS — Z418 Encounter for other procedures for purposes other than remedying health state: Secondary | ICD-10-CM | POA: Diagnosis not present

## 2016-02-29 DIAGNOSIS — T8249XA Other complication of vascular dialysis catheter, initial encounter: Secondary | ICD-10-CM | POA: Diagnosis not present

## 2016-03-01 DIAGNOSIS — Z992 Dependence on renal dialysis: Secondary | ICD-10-CM | POA: Diagnosis not present

## 2016-03-01 DIAGNOSIS — N186 End stage renal disease: Secondary | ICD-10-CM | POA: Diagnosis not present

## 2016-03-02 DIAGNOSIS — I252 Old myocardial infarction: Secondary | ICD-10-CM | POA: Diagnosis not present

## 2016-03-02 DIAGNOSIS — Z8673 Personal history of transient ischemic attack (TIA), and cerebral infarction without residual deficits: Secondary | ICD-10-CM | POA: Diagnosis not present

## 2016-03-02 DIAGNOSIS — E11622 Type 2 diabetes mellitus with other skin ulcer: Secondary | ICD-10-CM | POA: Diagnosis not present

## 2016-03-02 DIAGNOSIS — I120 Hypertensive chronic kidney disease with stage 5 chronic kidney disease or end stage renal disease: Secondary | ICD-10-CM | POA: Diagnosis not present

## 2016-03-02 DIAGNOSIS — E1122 Type 2 diabetes mellitus with diabetic chronic kidney disease: Secondary | ICD-10-CM | POA: Diagnosis not present

## 2016-03-02 DIAGNOSIS — L89154 Pressure ulcer of sacral region, stage 4: Secondary | ICD-10-CM | POA: Diagnosis not present

## 2016-03-02 DIAGNOSIS — Z992 Dependence on renal dialysis: Secondary | ICD-10-CM | POA: Diagnosis not present

## 2016-03-02 DIAGNOSIS — N186 End stage renal disease: Secondary | ICD-10-CM | POA: Diagnosis not present

## 2016-03-02 DIAGNOSIS — Z87891 Personal history of nicotine dependence: Secondary | ICD-10-CM | POA: Diagnosis not present

## 2016-03-03 DIAGNOSIS — N2581 Secondary hyperparathyroidism of renal origin: Secondary | ICD-10-CM | POA: Diagnosis not present

## 2016-03-03 DIAGNOSIS — D631 Anemia in chronic kidney disease: Secondary | ICD-10-CM | POA: Diagnosis not present

## 2016-03-03 DIAGNOSIS — Z418 Encounter for other procedures for purposes other than remedying health state: Secondary | ICD-10-CM | POA: Diagnosis not present

## 2016-03-03 DIAGNOSIS — N186 End stage renal disease: Secondary | ICD-10-CM | POA: Diagnosis not present

## 2016-03-03 DIAGNOSIS — D509 Iron deficiency anemia, unspecified: Secondary | ICD-10-CM | POA: Diagnosis not present

## 2016-03-05 DIAGNOSIS — Z418 Encounter for other procedures for purposes other than remedying health state: Secondary | ICD-10-CM | POA: Diagnosis not present

## 2016-03-05 DIAGNOSIS — N2581 Secondary hyperparathyroidism of renal origin: Secondary | ICD-10-CM | POA: Diagnosis not present

## 2016-03-05 DIAGNOSIS — D631 Anemia in chronic kidney disease: Secondary | ICD-10-CM | POA: Diagnosis not present

## 2016-03-05 DIAGNOSIS — N186 End stage renal disease: Secondary | ICD-10-CM | POA: Diagnosis not present

## 2016-03-05 DIAGNOSIS — D509 Iron deficiency anemia, unspecified: Secondary | ICD-10-CM | POA: Diagnosis not present

## 2016-03-05 DIAGNOSIS — E119 Type 2 diabetes mellitus without complications: Secondary | ICD-10-CM | POA: Diagnosis not present

## 2016-03-06 DIAGNOSIS — N186 End stage renal disease: Secondary | ICD-10-CM | POA: Diagnosis not present

## 2016-03-06 DIAGNOSIS — Z89511 Acquired absence of right leg below knee: Secondary | ICD-10-CM | POA: Diagnosis not present

## 2016-03-06 DIAGNOSIS — E119 Type 2 diabetes mellitus without complications: Secondary | ICD-10-CM | POA: Diagnosis not present

## 2016-03-06 DIAGNOSIS — Z7982 Long term (current) use of aspirin: Secondary | ICD-10-CM | POA: Diagnosis not present

## 2016-03-06 DIAGNOSIS — E1142 Type 2 diabetes mellitus with diabetic polyneuropathy: Secondary | ICD-10-CM | POA: Diagnosis not present

## 2016-03-06 DIAGNOSIS — G546 Phantom limb syndrome with pain: Secondary | ICD-10-CM | POA: Diagnosis not present

## 2016-03-06 DIAGNOSIS — Z8673 Personal history of transient ischemic attack (TIA), and cerebral infarction without residual deficits: Secondary | ICD-10-CM | POA: Diagnosis not present

## 2016-03-07 DIAGNOSIS — D509 Iron deficiency anemia, unspecified: Secondary | ICD-10-CM | POA: Diagnosis not present

## 2016-03-07 DIAGNOSIS — D631 Anemia in chronic kidney disease: Secondary | ICD-10-CM | POA: Diagnosis not present

## 2016-03-07 DIAGNOSIS — Z418 Encounter for other procedures for purposes other than remedying health state: Secondary | ICD-10-CM | POA: Diagnosis not present

## 2016-03-07 DIAGNOSIS — N186 End stage renal disease: Secondary | ICD-10-CM | POA: Diagnosis not present

## 2016-03-07 DIAGNOSIS — N2581 Secondary hyperparathyroidism of renal origin: Secondary | ICD-10-CM | POA: Diagnosis not present

## 2016-03-09 DIAGNOSIS — L89153 Pressure ulcer of sacral region, stage 3: Secondary | ICD-10-CM | POA: Diagnosis not present

## 2016-03-09 DIAGNOSIS — Z8673 Personal history of transient ischemic attack (TIA), and cerebral infarction without residual deficits: Secondary | ICD-10-CM | POA: Diagnosis not present

## 2016-03-09 DIAGNOSIS — N186 End stage renal disease: Secondary | ICD-10-CM | POA: Diagnosis not present

## 2016-03-09 DIAGNOSIS — I252 Old myocardial infarction: Secondary | ICD-10-CM | POA: Diagnosis not present

## 2016-03-09 DIAGNOSIS — E11622 Type 2 diabetes mellitus with other skin ulcer: Secondary | ICD-10-CM | POA: Diagnosis not present

## 2016-03-09 DIAGNOSIS — Z992 Dependence on renal dialysis: Secondary | ICD-10-CM | POA: Diagnosis not present

## 2016-03-09 DIAGNOSIS — Z87891 Personal history of nicotine dependence: Secondary | ICD-10-CM | POA: Diagnosis not present

## 2016-03-09 DIAGNOSIS — E1122 Type 2 diabetes mellitus with diabetic chronic kidney disease: Secondary | ICD-10-CM | POA: Diagnosis not present

## 2016-03-09 DIAGNOSIS — I120 Hypertensive chronic kidney disease with stage 5 chronic kidney disease or end stage renal disease: Secondary | ICD-10-CM | POA: Diagnosis not present

## 2016-03-09 DIAGNOSIS — L89154 Pressure ulcer of sacral region, stage 4: Secondary | ICD-10-CM | POA: Diagnosis not present

## 2016-03-10 DIAGNOSIS — D509 Iron deficiency anemia, unspecified: Secondary | ICD-10-CM | POA: Diagnosis not present

## 2016-03-10 DIAGNOSIS — N186 End stage renal disease: Secondary | ICD-10-CM | POA: Diagnosis not present

## 2016-03-10 DIAGNOSIS — D631 Anemia in chronic kidney disease: Secondary | ICD-10-CM | POA: Diagnosis not present

## 2016-03-10 DIAGNOSIS — Z418 Encounter for other procedures for purposes other than remedying health state: Secondary | ICD-10-CM | POA: Diagnosis not present

## 2016-03-10 DIAGNOSIS — N2581 Secondary hyperparathyroidism of renal origin: Secondary | ICD-10-CM | POA: Diagnosis not present

## 2016-03-12 DIAGNOSIS — Z418 Encounter for other procedures for purposes other than remedying health state: Secondary | ICD-10-CM | POA: Diagnosis not present

## 2016-03-12 DIAGNOSIS — N186 End stage renal disease: Secondary | ICD-10-CM | POA: Diagnosis not present

## 2016-03-12 DIAGNOSIS — D631 Anemia in chronic kidney disease: Secondary | ICD-10-CM | POA: Diagnosis not present

## 2016-03-12 DIAGNOSIS — D509 Iron deficiency anemia, unspecified: Secondary | ICD-10-CM | POA: Diagnosis not present

## 2016-03-14 DIAGNOSIS — D509 Iron deficiency anemia, unspecified: Secondary | ICD-10-CM | POA: Diagnosis not present

## 2016-03-14 DIAGNOSIS — D631 Anemia in chronic kidney disease: Secondary | ICD-10-CM | POA: Diagnosis not present

## 2016-03-14 DIAGNOSIS — Z418 Encounter for other procedures for purposes other than remedying health state: Secondary | ICD-10-CM | POA: Diagnosis not present

## 2016-03-14 DIAGNOSIS — N186 End stage renal disease: Secondary | ICD-10-CM | POA: Diagnosis not present

## 2016-03-16 DIAGNOSIS — N186 End stage renal disease: Secondary | ICD-10-CM | POA: Diagnosis not present

## 2016-03-16 DIAGNOSIS — I120 Hypertensive chronic kidney disease with stage 5 chronic kidney disease or end stage renal disease: Secondary | ICD-10-CM | POA: Diagnosis not present

## 2016-03-16 DIAGNOSIS — Z87891 Personal history of nicotine dependence: Secondary | ICD-10-CM | POA: Diagnosis not present

## 2016-03-16 DIAGNOSIS — Z992 Dependence on renal dialysis: Secondary | ICD-10-CM | POA: Diagnosis not present

## 2016-03-16 DIAGNOSIS — L89154 Pressure ulcer of sacral region, stage 4: Secondary | ICD-10-CM | POA: Diagnosis not present

## 2016-03-16 DIAGNOSIS — E11622 Type 2 diabetes mellitus with other skin ulcer: Secondary | ICD-10-CM | POA: Diagnosis not present

## 2016-03-16 DIAGNOSIS — E1122 Type 2 diabetes mellitus with diabetic chronic kidney disease: Secondary | ICD-10-CM | POA: Diagnosis not present

## 2016-03-16 DIAGNOSIS — Z8673 Personal history of transient ischemic attack (TIA), and cerebral infarction without residual deficits: Secondary | ICD-10-CM | POA: Diagnosis not present

## 2016-03-16 DIAGNOSIS — L89153 Pressure ulcer of sacral region, stage 3: Secondary | ICD-10-CM | POA: Diagnosis not present

## 2016-03-16 DIAGNOSIS — I252 Old myocardial infarction: Secondary | ICD-10-CM | POA: Diagnosis not present

## 2016-03-17 DIAGNOSIS — Z418 Encounter for other procedures for purposes other than remedying health state: Secondary | ICD-10-CM | POA: Diagnosis not present

## 2016-03-17 DIAGNOSIS — N186 End stage renal disease: Secondary | ICD-10-CM | POA: Diagnosis not present

## 2016-03-17 DIAGNOSIS — D631 Anemia in chronic kidney disease: Secondary | ICD-10-CM | POA: Diagnosis not present

## 2016-03-17 DIAGNOSIS — D509 Iron deficiency anemia, unspecified: Secondary | ICD-10-CM | POA: Diagnosis not present

## 2016-03-19 DIAGNOSIS — Z418 Encounter for other procedures for purposes other than remedying health state: Secondary | ICD-10-CM | POA: Diagnosis not present

## 2016-03-19 DIAGNOSIS — D631 Anemia in chronic kidney disease: Secondary | ICD-10-CM | POA: Diagnosis not present

## 2016-03-19 DIAGNOSIS — D509 Iron deficiency anemia, unspecified: Secondary | ICD-10-CM | POA: Diagnosis not present

## 2016-03-19 DIAGNOSIS — N186 End stage renal disease: Secondary | ICD-10-CM | POA: Diagnosis not present

## 2016-03-21 DIAGNOSIS — N186 End stage renal disease: Secondary | ICD-10-CM | POA: Diagnosis not present

## 2016-03-21 DIAGNOSIS — D509 Iron deficiency anemia, unspecified: Secondary | ICD-10-CM | POA: Diagnosis not present

## 2016-03-21 DIAGNOSIS — Z418 Encounter for other procedures for purposes other than remedying health state: Secondary | ICD-10-CM | POA: Diagnosis not present

## 2016-03-21 DIAGNOSIS — D631 Anemia in chronic kidney disease: Secondary | ICD-10-CM | POA: Diagnosis not present

## 2016-03-24 DIAGNOSIS — N186 End stage renal disease: Secondary | ICD-10-CM | POA: Diagnosis not present

## 2016-03-24 DIAGNOSIS — Z418 Encounter for other procedures for purposes other than remedying health state: Secondary | ICD-10-CM | POA: Diagnosis not present

## 2016-03-24 DIAGNOSIS — D631 Anemia in chronic kidney disease: Secondary | ICD-10-CM | POA: Diagnosis not present

## 2016-03-24 DIAGNOSIS — T8249XA Other complication of vascular dialysis catheter, initial encounter: Secondary | ICD-10-CM | POA: Diagnosis not present

## 2016-03-25 DIAGNOSIS — Z8673 Personal history of transient ischemic attack (TIA), and cerebral infarction without residual deficits: Secondary | ICD-10-CM | POA: Diagnosis not present

## 2016-03-25 DIAGNOSIS — E1122 Type 2 diabetes mellitus with diabetic chronic kidney disease: Secondary | ICD-10-CM | POA: Diagnosis not present

## 2016-03-25 DIAGNOSIS — I252 Old myocardial infarction: Secondary | ICD-10-CM | POA: Diagnosis not present

## 2016-03-25 DIAGNOSIS — I120 Hypertensive chronic kidney disease with stage 5 chronic kidney disease or end stage renal disease: Secondary | ICD-10-CM | POA: Diagnosis not present

## 2016-03-25 DIAGNOSIS — N186 End stage renal disease: Secondary | ICD-10-CM | POA: Diagnosis not present

## 2016-03-25 DIAGNOSIS — E11622 Type 2 diabetes mellitus with other skin ulcer: Secondary | ICD-10-CM | POA: Diagnosis not present

## 2016-03-25 DIAGNOSIS — L89154 Pressure ulcer of sacral region, stage 4: Secondary | ICD-10-CM | POA: Diagnosis not present

## 2016-03-25 DIAGNOSIS — Z87891 Personal history of nicotine dependence: Secondary | ICD-10-CM | POA: Diagnosis not present

## 2016-03-25 DIAGNOSIS — Z992 Dependence on renal dialysis: Secondary | ICD-10-CM | POA: Diagnosis not present

## 2016-03-26 DIAGNOSIS — N186 End stage renal disease: Secondary | ICD-10-CM | POA: Diagnosis not present

## 2016-03-26 DIAGNOSIS — Z418 Encounter for other procedures for purposes other than remedying health state: Secondary | ICD-10-CM | POA: Diagnosis not present

## 2016-03-26 DIAGNOSIS — T8249XA Other complication of vascular dialysis catheter, initial encounter: Secondary | ICD-10-CM | POA: Diagnosis not present

## 2016-03-26 DIAGNOSIS — D631 Anemia in chronic kidney disease: Secondary | ICD-10-CM | POA: Diagnosis not present

## 2016-03-27 DIAGNOSIS — N186 End stage renal disease: Secondary | ICD-10-CM | POA: Diagnosis not present

## 2016-03-27 DIAGNOSIS — Z992 Dependence on renal dialysis: Secondary | ICD-10-CM | POA: Diagnosis not present

## 2016-03-28 DIAGNOSIS — D631 Anemia in chronic kidney disease: Secondary | ICD-10-CM | POA: Diagnosis not present

## 2016-03-28 DIAGNOSIS — T8249XA Other complication of vascular dialysis catheter, initial encounter: Secondary | ICD-10-CM | POA: Diagnosis not present

## 2016-03-28 DIAGNOSIS — N186 End stage renal disease: Secondary | ICD-10-CM | POA: Diagnosis not present

## 2016-03-28 DIAGNOSIS — Z418 Encounter for other procedures for purposes other than remedying health state: Secondary | ICD-10-CM | POA: Diagnosis not present

## 2016-03-31 DIAGNOSIS — N186 End stage renal disease: Secondary | ICD-10-CM | POA: Diagnosis not present

## 2016-03-31 DIAGNOSIS — T8249XA Other complication of vascular dialysis catheter, initial encounter: Secondary | ICD-10-CM | POA: Diagnosis not present

## 2016-03-31 DIAGNOSIS — D631 Anemia in chronic kidney disease: Secondary | ICD-10-CM | POA: Diagnosis not present

## 2016-03-31 DIAGNOSIS — Z418 Encounter for other procedures for purposes other than remedying health state: Secondary | ICD-10-CM | POA: Diagnosis not present

## 2016-04-01 DIAGNOSIS — L89153 Pressure ulcer of sacral region, stage 3: Secondary | ICD-10-CM | POA: Diagnosis not present

## 2016-04-01 DIAGNOSIS — I252 Old myocardial infarction: Secondary | ICD-10-CM | POA: Diagnosis not present

## 2016-04-01 DIAGNOSIS — E1122 Type 2 diabetes mellitus with diabetic chronic kidney disease: Secondary | ICD-10-CM | POA: Diagnosis not present

## 2016-04-01 DIAGNOSIS — N186 End stage renal disease: Secondary | ICD-10-CM | POA: Diagnosis not present

## 2016-04-01 DIAGNOSIS — Z992 Dependence on renal dialysis: Secondary | ICD-10-CM | POA: Diagnosis not present

## 2016-04-01 DIAGNOSIS — E11622 Type 2 diabetes mellitus with other skin ulcer: Secondary | ICD-10-CM | POA: Diagnosis not present

## 2016-04-01 DIAGNOSIS — Z8673 Personal history of transient ischemic attack (TIA), and cerebral infarction without residual deficits: Secondary | ICD-10-CM | POA: Diagnosis not present

## 2016-04-01 DIAGNOSIS — Z87891 Personal history of nicotine dependence: Secondary | ICD-10-CM | POA: Diagnosis not present

## 2016-04-01 DIAGNOSIS — I120 Hypertensive chronic kidney disease with stage 5 chronic kidney disease or end stage renal disease: Secondary | ICD-10-CM | POA: Diagnosis not present

## 2016-04-01 DIAGNOSIS — L89154 Pressure ulcer of sacral region, stage 4: Secondary | ICD-10-CM | POA: Diagnosis not present

## 2016-04-02 DIAGNOSIS — E119 Type 2 diabetes mellitus without complications: Secondary | ICD-10-CM | POA: Diagnosis not present

## 2016-04-02 DIAGNOSIS — D631 Anemia in chronic kidney disease: Secondary | ICD-10-CM | POA: Diagnosis not present

## 2016-04-02 DIAGNOSIS — Z418 Encounter for other procedures for purposes other than remedying health state: Secondary | ICD-10-CM | POA: Diagnosis not present

## 2016-04-02 DIAGNOSIS — Z961 Presence of intraocular lens: Secondary | ICD-10-CM | POA: Diagnosis not present

## 2016-04-02 DIAGNOSIS — N186 End stage renal disease: Secondary | ICD-10-CM | POA: Diagnosis not present

## 2016-04-02 DIAGNOSIS — N2581 Secondary hyperparathyroidism of renal origin: Secondary | ICD-10-CM | POA: Diagnosis not present

## 2016-04-02 DIAGNOSIS — D509 Iron deficiency anemia, unspecified: Secondary | ICD-10-CM | POA: Diagnosis not present

## 2016-04-04 DIAGNOSIS — N2581 Secondary hyperparathyroidism of renal origin: Secondary | ICD-10-CM | POA: Diagnosis not present

## 2016-04-04 DIAGNOSIS — Z418 Encounter for other procedures for purposes other than remedying health state: Secondary | ICD-10-CM | POA: Diagnosis not present

## 2016-04-04 DIAGNOSIS — D631 Anemia in chronic kidney disease: Secondary | ICD-10-CM | POA: Diagnosis not present

## 2016-04-04 DIAGNOSIS — N186 End stage renal disease: Secondary | ICD-10-CM | POA: Diagnosis not present

## 2016-04-04 DIAGNOSIS — D509 Iron deficiency anemia, unspecified: Secondary | ICD-10-CM | POA: Diagnosis not present

## 2016-04-06 DIAGNOSIS — R748 Abnormal levels of other serum enzymes: Secondary | ICD-10-CM | POA: Diagnosis not present

## 2016-04-07 DIAGNOSIS — Z418 Encounter for other procedures for purposes other than remedying health state: Secondary | ICD-10-CM | POA: Diagnosis not present

## 2016-04-07 DIAGNOSIS — D631 Anemia in chronic kidney disease: Secondary | ICD-10-CM | POA: Diagnosis not present

## 2016-04-07 DIAGNOSIS — N186 End stage renal disease: Secondary | ICD-10-CM | POA: Diagnosis not present

## 2016-04-07 DIAGNOSIS — N2581 Secondary hyperparathyroidism of renal origin: Secondary | ICD-10-CM | POA: Diagnosis not present

## 2016-04-07 DIAGNOSIS — D509 Iron deficiency anemia, unspecified: Secondary | ICD-10-CM | POA: Diagnosis not present

## 2016-04-08 DIAGNOSIS — Z8673 Personal history of transient ischemic attack (TIA), and cerebral infarction without residual deficits: Secondary | ICD-10-CM | POA: Diagnosis not present

## 2016-04-08 DIAGNOSIS — N186 End stage renal disease: Secondary | ICD-10-CM | POA: Diagnosis not present

## 2016-04-08 DIAGNOSIS — I2510 Atherosclerotic heart disease of native coronary artery without angina pectoris: Secondary | ICD-10-CM | POA: Diagnosis not present

## 2016-04-08 DIAGNOSIS — I252 Old myocardial infarction: Secondary | ICD-10-CM | POA: Diagnosis not present

## 2016-04-08 DIAGNOSIS — E1122 Type 2 diabetes mellitus with diabetic chronic kidney disease: Secondary | ICD-10-CM | POA: Diagnosis not present

## 2016-04-08 DIAGNOSIS — E11622 Type 2 diabetes mellitus with other skin ulcer: Secondary | ICD-10-CM | POA: Diagnosis not present

## 2016-04-08 DIAGNOSIS — L89154 Pressure ulcer of sacral region, stage 4: Secondary | ICD-10-CM | POA: Diagnosis not present

## 2016-04-08 DIAGNOSIS — Z992 Dependence on renal dialysis: Secondary | ICD-10-CM | POA: Diagnosis not present

## 2016-04-08 DIAGNOSIS — L89153 Pressure ulcer of sacral region, stage 3: Secondary | ICD-10-CM | POA: Diagnosis not present

## 2016-04-08 DIAGNOSIS — I120 Hypertensive chronic kidney disease with stage 5 chronic kidney disease or end stage renal disease: Secondary | ICD-10-CM | POA: Diagnosis not present

## 2016-04-09 DIAGNOSIS — E119 Type 2 diabetes mellitus without complications: Secondary | ICD-10-CM | POA: Diagnosis not present

## 2016-04-09 DIAGNOSIS — D509 Iron deficiency anemia, unspecified: Secondary | ICD-10-CM | POA: Diagnosis not present

## 2016-04-09 DIAGNOSIS — N186 End stage renal disease: Secondary | ICD-10-CM | POA: Diagnosis not present

## 2016-04-09 DIAGNOSIS — N2581 Secondary hyperparathyroidism of renal origin: Secondary | ICD-10-CM | POA: Diagnosis not present

## 2016-04-09 DIAGNOSIS — Z418 Encounter for other procedures for purposes other than remedying health state: Secondary | ICD-10-CM | POA: Diagnosis not present

## 2016-04-09 DIAGNOSIS — D631 Anemia in chronic kidney disease: Secondary | ICD-10-CM | POA: Diagnosis not present

## 2016-04-11 DIAGNOSIS — N2581 Secondary hyperparathyroidism of renal origin: Secondary | ICD-10-CM | POA: Diagnosis not present

## 2016-04-11 DIAGNOSIS — Z418 Encounter for other procedures for purposes other than remedying health state: Secondary | ICD-10-CM | POA: Diagnosis not present

## 2016-04-11 DIAGNOSIS — D509 Iron deficiency anemia, unspecified: Secondary | ICD-10-CM | POA: Diagnosis not present

## 2016-04-11 DIAGNOSIS — N186 End stage renal disease: Secondary | ICD-10-CM | POA: Diagnosis not present

## 2016-04-11 DIAGNOSIS — D631 Anemia in chronic kidney disease: Secondary | ICD-10-CM | POA: Diagnosis not present

## 2016-04-14 DIAGNOSIS — Z418 Encounter for other procedures for purposes other than remedying health state: Secondary | ICD-10-CM | POA: Diagnosis not present

## 2016-04-14 DIAGNOSIS — D509 Iron deficiency anemia, unspecified: Secondary | ICD-10-CM | POA: Diagnosis not present

## 2016-04-14 DIAGNOSIS — D631 Anemia in chronic kidney disease: Secondary | ICD-10-CM | POA: Diagnosis not present

## 2016-04-14 DIAGNOSIS — T8249XA Other complication of vascular dialysis catheter, initial encounter: Secondary | ICD-10-CM | POA: Diagnosis not present

## 2016-04-14 DIAGNOSIS — N186 End stage renal disease: Secondary | ICD-10-CM | POA: Diagnosis not present

## 2016-04-15 DIAGNOSIS — N186 End stage renal disease: Secondary | ICD-10-CM | POA: Diagnosis not present

## 2016-04-15 DIAGNOSIS — L89154 Pressure ulcer of sacral region, stage 4: Secondary | ICD-10-CM | POA: Diagnosis not present

## 2016-04-15 DIAGNOSIS — I120 Hypertensive chronic kidney disease with stage 5 chronic kidney disease or end stage renal disease: Secondary | ICD-10-CM | POA: Diagnosis not present

## 2016-04-15 DIAGNOSIS — E1122 Type 2 diabetes mellitus with diabetic chronic kidney disease: Secondary | ICD-10-CM | POA: Diagnosis not present

## 2016-04-15 DIAGNOSIS — Z992 Dependence on renal dialysis: Secondary | ICD-10-CM | POA: Diagnosis not present

## 2016-04-15 DIAGNOSIS — I252 Old myocardial infarction: Secondary | ICD-10-CM | POA: Diagnosis not present

## 2016-04-15 DIAGNOSIS — Z8673 Personal history of transient ischemic attack (TIA), and cerebral infarction without residual deficits: Secondary | ICD-10-CM | POA: Diagnosis not present

## 2016-04-15 DIAGNOSIS — I2510 Atherosclerotic heart disease of native coronary artery without angina pectoris: Secondary | ICD-10-CM | POA: Diagnosis not present

## 2016-04-15 DIAGNOSIS — E11622 Type 2 diabetes mellitus with other skin ulcer: Secondary | ICD-10-CM | POA: Diagnosis not present

## 2016-04-16 DIAGNOSIS — D509 Iron deficiency anemia, unspecified: Secondary | ICD-10-CM | POA: Diagnosis not present

## 2016-04-16 DIAGNOSIS — Z418 Encounter for other procedures for purposes other than remedying health state: Secondary | ICD-10-CM | POA: Diagnosis not present

## 2016-04-16 DIAGNOSIS — D631 Anemia in chronic kidney disease: Secondary | ICD-10-CM | POA: Diagnosis not present

## 2016-04-16 DIAGNOSIS — N186 End stage renal disease: Secondary | ICD-10-CM | POA: Diagnosis not present

## 2016-04-16 DIAGNOSIS — T8249XA Other complication of vascular dialysis catheter, initial encounter: Secondary | ICD-10-CM | POA: Diagnosis not present

## 2016-04-17 DIAGNOSIS — I120 Hypertensive chronic kidney disease with stage 5 chronic kidney disease or end stage renal disease: Secondary | ICD-10-CM | POA: Diagnosis not present

## 2016-04-17 DIAGNOSIS — Z79899 Other long term (current) drug therapy: Secondary | ICD-10-CM | POA: Diagnosis not present

## 2016-04-17 DIAGNOSIS — Z992 Dependence on renal dialysis: Secondary | ICD-10-CM | POA: Diagnosis not present

## 2016-04-17 DIAGNOSIS — Z7982 Long term (current) use of aspirin: Secondary | ICD-10-CM | POA: Diagnosis not present

## 2016-04-17 DIAGNOSIS — Z9841 Cataract extraction status, right eye: Secondary | ICD-10-CM | POA: Diagnosis not present

## 2016-04-17 DIAGNOSIS — N186 End stage renal disease: Secondary | ICD-10-CM | POA: Diagnosis not present

## 2016-04-17 DIAGNOSIS — E1140 Type 2 diabetes mellitus with diabetic neuropathy, unspecified: Secondary | ICD-10-CM | POA: Diagnosis not present

## 2016-04-17 DIAGNOSIS — E1122 Type 2 diabetes mellitus with diabetic chronic kidney disease: Secondary | ICD-10-CM | POA: Diagnosis not present

## 2016-04-17 DIAGNOSIS — I2510 Atherosclerotic heart disease of native coronary artery without angina pectoris: Secondary | ICD-10-CM | POA: Diagnosis not present

## 2016-04-17 DIAGNOSIS — Z89511 Acquired absence of right leg below knee: Secondary | ICD-10-CM | POA: Diagnosis not present

## 2016-04-17 DIAGNOSIS — Z79890 Hormone replacement therapy (postmenopausal): Secondary | ICD-10-CM | POA: Diagnosis not present

## 2016-04-17 DIAGNOSIS — I252 Old myocardial infarction: Secondary | ICD-10-CM | POA: Diagnosis not present

## 2016-04-17 DIAGNOSIS — Z87891 Personal history of nicotine dependence: Secondary | ICD-10-CM | POA: Diagnosis not present

## 2016-04-17 DIAGNOSIS — Z9842 Cataract extraction status, left eye: Secondary | ICD-10-CM | POA: Diagnosis not present

## 2016-04-18 DIAGNOSIS — T8249XA Other complication of vascular dialysis catheter, initial encounter: Secondary | ICD-10-CM | POA: Diagnosis not present

## 2016-04-18 DIAGNOSIS — Z418 Encounter for other procedures for purposes other than remedying health state: Secondary | ICD-10-CM | POA: Diagnosis not present

## 2016-04-18 DIAGNOSIS — D631 Anemia in chronic kidney disease: Secondary | ICD-10-CM | POA: Diagnosis not present

## 2016-04-18 DIAGNOSIS — N186 End stage renal disease: Secondary | ICD-10-CM | POA: Diagnosis not present

## 2016-04-18 DIAGNOSIS — D509 Iron deficiency anemia, unspecified: Secondary | ICD-10-CM | POA: Diagnosis not present

## 2016-04-20 DIAGNOSIS — E1122 Type 2 diabetes mellitus with diabetic chronic kidney disease: Secondary | ICD-10-CM | POA: Diagnosis not present

## 2016-04-20 DIAGNOSIS — Z8673 Personal history of transient ischemic attack (TIA), and cerebral infarction without residual deficits: Secondary | ICD-10-CM | POA: Diagnosis not present

## 2016-04-20 DIAGNOSIS — E11622 Type 2 diabetes mellitus with other skin ulcer: Secondary | ICD-10-CM | POA: Diagnosis not present

## 2016-04-20 DIAGNOSIS — Z992 Dependence on renal dialysis: Secondary | ICD-10-CM | POA: Diagnosis not present

## 2016-04-20 DIAGNOSIS — N186 End stage renal disease: Secondary | ICD-10-CM | POA: Diagnosis not present

## 2016-04-20 DIAGNOSIS — I120 Hypertensive chronic kidney disease with stage 5 chronic kidney disease or end stage renal disease: Secondary | ICD-10-CM | POA: Diagnosis not present

## 2016-04-20 DIAGNOSIS — L89154 Pressure ulcer of sacral region, stage 4: Secondary | ICD-10-CM | POA: Diagnosis not present

## 2016-04-20 DIAGNOSIS — I252 Old myocardial infarction: Secondary | ICD-10-CM | POA: Diagnosis not present

## 2016-04-20 DIAGNOSIS — I2510 Atherosclerotic heart disease of native coronary artery without angina pectoris: Secondary | ICD-10-CM | POA: Diagnosis not present

## 2016-04-21 DIAGNOSIS — D509 Iron deficiency anemia, unspecified: Secondary | ICD-10-CM | POA: Diagnosis not present

## 2016-04-21 DIAGNOSIS — Z418 Encounter for other procedures for purposes other than remedying health state: Secondary | ICD-10-CM | POA: Diagnosis not present

## 2016-04-21 DIAGNOSIS — T8249XA Other complication of vascular dialysis catheter, initial encounter: Secondary | ICD-10-CM | POA: Diagnosis not present

## 2016-04-21 DIAGNOSIS — D631 Anemia in chronic kidney disease: Secondary | ICD-10-CM | POA: Diagnosis not present

## 2016-04-21 DIAGNOSIS — N186 End stage renal disease: Secondary | ICD-10-CM | POA: Diagnosis not present

## 2016-04-23 DIAGNOSIS — Z418 Encounter for other procedures for purposes other than remedying health state: Secondary | ICD-10-CM | POA: Diagnosis not present

## 2016-04-23 DIAGNOSIS — D509 Iron deficiency anemia, unspecified: Secondary | ICD-10-CM | POA: Diagnosis not present

## 2016-04-23 DIAGNOSIS — N186 End stage renal disease: Secondary | ICD-10-CM | POA: Diagnosis not present

## 2016-04-23 DIAGNOSIS — D631 Anemia in chronic kidney disease: Secondary | ICD-10-CM | POA: Diagnosis not present

## 2016-04-27 DIAGNOSIS — Z8673 Personal history of transient ischemic attack (TIA), and cerebral infarction without residual deficits: Secondary | ICD-10-CM | POA: Diagnosis not present

## 2016-04-27 DIAGNOSIS — L89154 Pressure ulcer of sacral region, stage 4: Secondary | ICD-10-CM | POA: Diagnosis not present

## 2016-04-27 DIAGNOSIS — N186 End stage renal disease: Secondary | ICD-10-CM | POA: Diagnosis not present

## 2016-04-27 DIAGNOSIS — E11622 Type 2 diabetes mellitus with other skin ulcer: Secondary | ICD-10-CM | POA: Diagnosis not present

## 2016-04-27 DIAGNOSIS — I252 Old myocardial infarction: Secondary | ICD-10-CM | POA: Diagnosis not present

## 2016-04-27 DIAGNOSIS — I2510 Atherosclerotic heart disease of native coronary artery without angina pectoris: Secondary | ICD-10-CM | POA: Diagnosis not present

## 2016-04-27 DIAGNOSIS — I120 Hypertensive chronic kidney disease with stage 5 chronic kidney disease or end stage renal disease: Secondary | ICD-10-CM | POA: Diagnosis not present

## 2016-04-27 DIAGNOSIS — E1122 Type 2 diabetes mellitus with diabetic chronic kidney disease: Secondary | ICD-10-CM | POA: Diagnosis not present

## 2016-04-27 DIAGNOSIS — Z992 Dependence on renal dialysis: Secondary | ICD-10-CM | POA: Diagnosis not present

## 2016-04-28 DIAGNOSIS — Z418 Encounter for other procedures for purposes other than remedying health state: Secondary | ICD-10-CM | POA: Diagnosis not present

## 2016-04-28 DIAGNOSIS — N186 End stage renal disease: Secondary | ICD-10-CM | POA: Diagnosis not present

## 2016-04-28 DIAGNOSIS — D509 Iron deficiency anemia, unspecified: Secondary | ICD-10-CM | POA: Diagnosis not present

## 2016-04-28 DIAGNOSIS — D631 Anemia in chronic kidney disease: Secondary | ICD-10-CM | POA: Diagnosis not present

## 2016-04-30 DIAGNOSIS — D509 Iron deficiency anemia, unspecified: Secondary | ICD-10-CM | POA: Diagnosis not present

## 2016-04-30 DIAGNOSIS — N186 End stage renal disease: Secondary | ICD-10-CM | POA: Diagnosis not present

## 2016-04-30 DIAGNOSIS — Z418 Encounter for other procedures for purposes other than remedying health state: Secondary | ICD-10-CM | POA: Diagnosis not present

## 2016-04-30 DIAGNOSIS — D631 Anemia in chronic kidney disease: Secondary | ICD-10-CM | POA: Diagnosis not present

## 2016-05-01 DIAGNOSIS — G546 Phantom limb syndrome with pain: Secondary | ICD-10-CM | POA: Diagnosis not present

## 2016-05-01 DIAGNOSIS — Z992 Dependence on renal dialysis: Secondary | ICD-10-CM | POA: Diagnosis not present

## 2016-05-01 DIAGNOSIS — E1142 Type 2 diabetes mellitus with diabetic polyneuropathy: Secondary | ICD-10-CM | POA: Diagnosis not present

## 2016-05-01 DIAGNOSIS — T879 Unspecified complications of amputation stump: Secondary | ICD-10-CM | POA: Diagnosis not present

## 2016-05-01 DIAGNOSIS — Z8673 Personal history of transient ischemic attack (TIA), and cerebral infarction without residual deficits: Secondary | ICD-10-CM | POA: Diagnosis not present

## 2016-05-01 DIAGNOSIS — M6281 Muscle weakness (generalized): Secondary | ICD-10-CM | POA: Diagnosis not present

## 2016-05-01 DIAGNOSIS — Z7982 Long term (current) use of aspirin: Secondary | ICD-10-CM | POA: Diagnosis not present

## 2016-05-01 DIAGNOSIS — N186 End stage renal disease: Secondary | ICD-10-CM | POA: Diagnosis not present

## 2016-05-02 DIAGNOSIS — D509 Iron deficiency anemia, unspecified: Secondary | ICD-10-CM | POA: Diagnosis not present

## 2016-05-02 DIAGNOSIS — N186 End stage renal disease: Secondary | ICD-10-CM | POA: Diagnosis not present

## 2016-05-02 DIAGNOSIS — N2581 Secondary hyperparathyroidism of renal origin: Secondary | ICD-10-CM | POA: Diagnosis not present

## 2016-05-02 DIAGNOSIS — D631 Anemia in chronic kidney disease: Secondary | ICD-10-CM | POA: Diagnosis not present

## 2016-05-02 DIAGNOSIS — Z418 Encounter for other procedures for purposes other than remedying health state: Secondary | ICD-10-CM | POA: Diagnosis not present

## 2016-05-04 DIAGNOSIS — M6281 Muscle weakness (generalized): Secondary | ICD-10-CM | POA: Diagnosis not present

## 2016-05-04 DIAGNOSIS — I120 Hypertensive chronic kidney disease with stage 5 chronic kidney disease or end stage renal disease: Secondary | ICD-10-CM | POA: Diagnosis not present

## 2016-05-04 DIAGNOSIS — I2510 Atherosclerotic heart disease of native coronary artery without angina pectoris: Secondary | ICD-10-CM | POA: Diagnosis not present

## 2016-05-04 DIAGNOSIS — E1122 Type 2 diabetes mellitus with diabetic chronic kidney disease: Secondary | ICD-10-CM | POA: Diagnosis not present

## 2016-05-04 DIAGNOSIS — Z8673 Personal history of transient ischemic attack (TIA), and cerebral infarction without residual deficits: Secondary | ICD-10-CM | POA: Diagnosis not present

## 2016-05-04 DIAGNOSIS — T879 Unspecified complications of amputation stump: Secondary | ICD-10-CM | POA: Diagnosis not present

## 2016-05-04 DIAGNOSIS — L89154 Pressure ulcer of sacral region, stage 4: Secondary | ICD-10-CM | POA: Diagnosis not present

## 2016-05-04 DIAGNOSIS — Z89511 Acquired absence of right leg below knee: Secondary | ICD-10-CM | POA: Diagnosis not present

## 2016-05-04 DIAGNOSIS — Z992 Dependence on renal dialysis: Secondary | ICD-10-CM | POA: Diagnosis not present

## 2016-05-04 DIAGNOSIS — N186 End stage renal disease: Secondary | ICD-10-CM | POA: Diagnosis not present

## 2016-05-04 DIAGNOSIS — I252 Old myocardial infarction: Secondary | ICD-10-CM | POA: Diagnosis not present

## 2016-05-05 DIAGNOSIS — D509 Iron deficiency anemia, unspecified: Secondary | ICD-10-CM | POA: Diagnosis not present

## 2016-05-05 DIAGNOSIS — T879 Unspecified complications of amputation stump: Secondary | ICD-10-CM | POA: Diagnosis not present

## 2016-05-05 DIAGNOSIS — N2581 Secondary hyperparathyroidism of renal origin: Secondary | ICD-10-CM | POA: Diagnosis not present

## 2016-05-05 DIAGNOSIS — D631 Anemia in chronic kidney disease: Secondary | ICD-10-CM | POA: Diagnosis not present

## 2016-05-05 DIAGNOSIS — Z418 Encounter for other procedures for purposes other than remedying health state: Secondary | ICD-10-CM | POA: Diagnosis not present

## 2016-05-05 DIAGNOSIS — M6281 Muscle weakness (generalized): Secondary | ICD-10-CM | POA: Diagnosis not present

## 2016-05-05 DIAGNOSIS — N186 End stage renal disease: Secondary | ICD-10-CM | POA: Diagnosis not present

## 2016-05-06 DIAGNOSIS — T879 Unspecified complications of amputation stump: Secondary | ICD-10-CM | POA: Diagnosis not present

## 2016-05-06 DIAGNOSIS — M6281 Muscle weakness (generalized): Secondary | ICD-10-CM | POA: Diagnosis not present

## 2016-05-07 DIAGNOSIS — T879 Unspecified complications of amputation stump: Secondary | ICD-10-CM | POA: Diagnosis not present

## 2016-05-07 DIAGNOSIS — E119 Type 2 diabetes mellitus without complications: Secondary | ICD-10-CM | POA: Diagnosis not present

## 2016-05-07 DIAGNOSIS — Z418 Encounter for other procedures for purposes other than remedying health state: Secondary | ICD-10-CM | POA: Diagnosis not present

## 2016-05-07 DIAGNOSIS — D509 Iron deficiency anemia, unspecified: Secondary | ICD-10-CM | POA: Diagnosis not present

## 2016-05-07 DIAGNOSIS — N2581 Secondary hyperparathyroidism of renal origin: Secondary | ICD-10-CM | POA: Diagnosis not present

## 2016-05-07 DIAGNOSIS — D631 Anemia in chronic kidney disease: Secondary | ICD-10-CM | POA: Diagnosis not present

## 2016-05-07 DIAGNOSIS — N186 End stage renal disease: Secondary | ICD-10-CM | POA: Diagnosis not present

## 2016-05-07 DIAGNOSIS — M6281 Muscle weakness (generalized): Secondary | ICD-10-CM | POA: Diagnosis not present

## 2016-05-08 DIAGNOSIS — T879 Unspecified complications of amputation stump: Secondary | ICD-10-CM | POA: Diagnosis not present

## 2016-05-08 DIAGNOSIS — M6281 Muscle weakness (generalized): Secondary | ICD-10-CM | POA: Diagnosis not present

## 2016-05-09 DIAGNOSIS — Z418 Encounter for other procedures for purposes other than remedying health state: Secondary | ICD-10-CM | POA: Diagnosis not present

## 2016-05-09 DIAGNOSIS — N2581 Secondary hyperparathyroidism of renal origin: Secondary | ICD-10-CM | POA: Diagnosis not present

## 2016-05-09 DIAGNOSIS — D509 Iron deficiency anemia, unspecified: Secondary | ICD-10-CM | POA: Diagnosis not present

## 2016-05-09 DIAGNOSIS — N186 End stage renal disease: Secondary | ICD-10-CM | POA: Diagnosis not present

## 2016-05-09 DIAGNOSIS — D631 Anemia in chronic kidney disease: Secondary | ICD-10-CM | POA: Diagnosis not present

## 2016-05-11 DIAGNOSIS — I120 Hypertensive chronic kidney disease with stage 5 chronic kidney disease or end stage renal disease: Secondary | ICD-10-CM | POA: Diagnosis not present

## 2016-05-11 DIAGNOSIS — Z8673 Personal history of transient ischemic attack (TIA), and cerebral infarction without residual deficits: Secondary | ICD-10-CM | POA: Diagnosis not present

## 2016-05-11 DIAGNOSIS — T879 Unspecified complications of amputation stump: Secondary | ICD-10-CM | POA: Diagnosis not present

## 2016-05-11 DIAGNOSIS — E1122 Type 2 diabetes mellitus with diabetic chronic kidney disease: Secondary | ICD-10-CM | POA: Diagnosis not present

## 2016-05-11 DIAGNOSIS — M6281 Muscle weakness (generalized): Secondary | ICD-10-CM | POA: Diagnosis not present

## 2016-05-11 DIAGNOSIS — L89153 Pressure ulcer of sacral region, stage 3: Secondary | ICD-10-CM | POA: Diagnosis not present

## 2016-05-11 DIAGNOSIS — I252 Old myocardial infarction: Secondary | ICD-10-CM | POA: Diagnosis not present

## 2016-05-11 DIAGNOSIS — L89154 Pressure ulcer of sacral region, stage 4: Secondary | ICD-10-CM | POA: Diagnosis not present

## 2016-05-11 DIAGNOSIS — N186 End stage renal disease: Secondary | ICD-10-CM | POA: Diagnosis not present

## 2016-05-11 DIAGNOSIS — I2510 Atherosclerotic heart disease of native coronary artery without angina pectoris: Secondary | ICD-10-CM | POA: Diagnosis not present

## 2016-05-11 DIAGNOSIS — Z992 Dependence on renal dialysis: Secondary | ICD-10-CM | POA: Diagnosis not present

## 2016-05-12 DIAGNOSIS — D631 Anemia in chronic kidney disease: Secondary | ICD-10-CM | POA: Diagnosis not present

## 2016-05-12 DIAGNOSIS — D509 Iron deficiency anemia, unspecified: Secondary | ICD-10-CM | POA: Diagnosis not present

## 2016-05-12 DIAGNOSIS — N186 End stage renal disease: Secondary | ICD-10-CM | POA: Diagnosis not present

## 2016-05-12 DIAGNOSIS — Z418 Encounter for other procedures for purposes other than remedying health state: Secondary | ICD-10-CM | POA: Diagnosis not present

## 2016-05-12 DIAGNOSIS — Z23 Encounter for immunization: Secondary | ICD-10-CM | POA: Diagnosis not present

## 2016-05-12 DIAGNOSIS — M6281 Muscle weakness (generalized): Secondary | ICD-10-CM | POA: Diagnosis not present

## 2016-05-12 DIAGNOSIS — T879 Unspecified complications of amputation stump: Secondary | ICD-10-CM | POA: Diagnosis not present

## 2016-05-13 DIAGNOSIS — M6281 Muscle weakness (generalized): Secondary | ICD-10-CM | POA: Diagnosis not present

## 2016-05-13 DIAGNOSIS — T879 Unspecified complications of amputation stump: Secondary | ICD-10-CM | POA: Diagnosis not present

## 2016-05-14 DIAGNOSIS — Z23 Encounter for immunization: Secondary | ICD-10-CM | POA: Diagnosis not present

## 2016-05-14 DIAGNOSIS — M6281 Muscle weakness (generalized): Secondary | ICD-10-CM | POA: Diagnosis not present

## 2016-05-14 DIAGNOSIS — D631 Anemia in chronic kidney disease: Secondary | ICD-10-CM | POA: Diagnosis not present

## 2016-05-14 DIAGNOSIS — T879 Unspecified complications of amputation stump: Secondary | ICD-10-CM | POA: Diagnosis not present

## 2016-05-14 DIAGNOSIS — N186 End stage renal disease: Secondary | ICD-10-CM | POA: Diagnosis not present

## 2016-05-14 DIAGNOSIS — D509 Iron deficiency anemia, unspecified: Secondary | ICD-10-CM | POA: Diagnosis not present

## 2016-05-14 DIAGNOSIS — Z418 Encounter for other procedures for purposes other than remedying health state: Secondary | ICD-10-CM | POA: Diagnosis not present

## 2016-05-15 DIAGNOSIS — T879 Unspecified complications of amputation stump: Secondary | ICD-10-CM | POA: Diagnosis not present

## 2016-05-15 DIAGNOSIS — M6281 Muscle weakness (generalized): Secondary | ICD-10-CM | POA: Diagnosis not present

## 2016-05-16 DIAGNOSIS — Z23 Encounter for immunization: Secondary | ICD-10-CM | POA: Diagnosis not present

## 2016-05-16 DIAGNOSIS — N186 End stage renal disease: Secondary | ICD-10-CM | POA: Diagnosis not present

## 2016-05-16 DIAGNOSIS — D631 Anemia in chronic kidney disease: Secondary | ICD-10-CM | POA: Diagnosis not present

## 2016-05-16 DIAGNOSIS — Z418 Encounter for other procedures for purposes other than remedying health state: Secondary | ICD-10-CM | POA: Diagnosis not present

## 2016-05-16 DIAGNOSIS — D509 Iron deficiency anemia, unspecified: Secondary | ICD-10-CM | POA: Diagnosis not present

## 2016-05-18 DIAGNOSIS — I2510 Atherosclerotic heart disease of native coronary artery without angina pectoris: Secondary | ICD-10-CM | POA: Diagnosis not present

## 2016-05-18 DIAGNOSIS — N186 End stage renal disease: Secondary | ICD-10-CM | POA: Diagnosis not present

## 2016-05-18 DIAGNOSIS — T879 Unspecified complications of amputation stump: Secondary | ICD-10-CM | POA: Diagnosis not present

## 2016-05-18 DIAGNOSIS — L89154 Pressure ulcer of sacral region, stage 4: Secondary | ICD-10-CM | POA: Diagnosis not present

## 2016-05-18 DIAGNOSIS — I252 Old myocardial infarction: Secondary | ICD-10-CM | POA: Diagnosis not present

## 2016-05-18 DIAGNOSIS — Z992 Dependence on renal dialysis: Secondary | ICD-10-CM | POA: Diagnosis not present

## 2016-05-18 DIAGNOSIS — E1122 Type 2 diabetes mellitus with diabetic chronic kidney disease: Secondary | ICD-10-CM | POA: Diagnosis not present

## 2016-05-18 DIAGNOSIS — Z8673 Personal history of transient ischemic attack (TIA), and cerebral infarction without residual deficits: Secondary | ICD-10-CM | POA: Diagnosis not present

## 2016-05-18 DIAGNOSIS — M6281 Muscle weakness (generalized): Secondary | ICD-10-CM | POA: Diagnosis not present

## 2016-05-18 DIAGNOSIS — I120 Hypertensive chronic kidney disease with stage 5 chronic kidney disease or end stage renal disease: Secondary | ICD-10-CM | POA: Diagnosis not present

## 2016-05-19 DIAGNOSIS — D509 Iron deficiency anemia, unspecified: Secondary | ICD-10-CM | POA: Diagnosis not present

## 2016-05-19 DIAGNOSIS — Z418 Encounter for other procedures for purposes other than remedying health state: Secondary | ICD-10-CM | POA: Diagnosis not present

## 2016-05-19 DIAGNOSIS — M6281 Muscle weakness (generalized): Secondary | ICD-10-CM | POA: Diagnosis not present

## 2016-05-19 DIAGNOSIS — N186 End stage renal disease: Secondary | ICD-10-CM | POA: Diagnosis not present

## 2016-05-19 DIAGNOSIS — Z23 Encounter for immunization: Secondary | ICD-10-CM | POA: Diagnosis not present

## 2016-05-19 DIAGNOSIS — T879 Unspecified complications of amputation stump: Secondary | ICD-10-CM | POA: Diagnosis not present

## 2016-05-19 DIAGNOSIS — D631 Anemia in chronic kidney disease: Secondary | ICD-10-CM | POA: Diagnosis not present

## 2016-05-21 DIAGNOSIS — Z418 Encounter for other procedures for purposes other than remedying health state: Secondary | ICD-10-CM | POA: Diagnosis not present

## 2016-05-21 DIAGNOSIS — Z23 Encounter for immunization: Secondary | ICD-10-CM | POA: Diagnosis not present

## 2016-05-21 DIAGNOSIS — N186 End stage renal disease: Secondary | ICD-10-CM | POA: Diagnosis not present

## 2016-05-21 DIAGNOSIS — T879 Unspecified complications of amputation stump: Secondary | ICD-10-CM | POA: Diagnosis not present

## 2016-05-21 DIAGNOSIS — D631 Anemia in chronic kidney disease: Secondary | ICD-10-CM | POA: Diagnosis not present

## 2016-05-21 DIAGNOSIS — D509 Iron deficiency anemia, unspecified: Secondary | ICD-10-CM | POA: Diagnosis not present

## 2016-05-21 DIAGNOSIS — M6281 Muscle weakness (generalized): Secondary | ICD-10-CM | POA: Diagnosis not present

## 2016-05-25 DIAGNOSIS — Z992 Dependence on renal dialysis: Secondary | ICD-10-CM | POA: Diagnosis not present

## 2016-05-25 DIAGNOSIS — T829XXA Unspecified complication of cardiac and vascular prosthetic device, implant and graft, initial encounter: Secondary | ICD-10-CM | POA: Diagnosis not present

## 2016-05-25 DIAGNOSIS — N186 End stage renal disease: Secondary | ICD-10-CM | POA: Diagnosis not present

## 2016-05-26 DIAGNOSIS — Z418 Encounter for other procedures for purposes other than remedying health state: Secondary | ICD-10-CM | POA: Diagnosis not present

## 2016-05-26 DIAGNOSIS — N186 End stage renal disease: Secondary | ICD-10-CM | POA: Diagnosis not present

## 2016-05-26 DIAGNOSIS — D509 Iron deficiency anemia, unspecified: Secondary | ICD-10-CM | POA: Diagnosis not present

## 2016-05-26 DIAGNOSIS — D631 Anemia in chronic kidney disease: Secondary | ICD-10-CM | POA: Diagnosis not present

## 2016-05-28 DIAGNOSIS — D631 Anemia in chronic kidney disease: Secondary | ICD-10-CM | POA: Diagnosis not present

## 2016-05-28 DIAGNOSIS — Z418 Encounter for other procedures for purposes other than remedying health state: Secondary | ICD-10-CM | POA: Diagnosis not present

## 2016-05-28 DIAGNOSIS — N186 End stage renal disease: Secondary | ICD-10-CM | POA: Diagnosis not present

## 2016-05-28 DIAGNOSIS — D509 Iron deficiency anemia, unspecified: Secondary | ICD-10-CM | POA: Diagnosis not present

## 2016-05-30 DIAGNOSIS — Z418 Encounter for other procedures for purposes other than remedying health state: Secondary | ICD-10-CM | POA: Diagnosis not present

## 2016-05-30 DIAGNOSIS — D509 Iron deficiency anemia, unspecified: Secondary | ICD-10-CM | POA: Diagnosis not present

## 2016-05-30 DIAGNOSIS — N186 End stage renal disease: Secondary | ICD-10-CM | POA: Diagnosis not present

## 2016-05-30 DIAGNOSIS — D631 Anemia in chronic kidney disease: Secondary | ICD-10-CM | POA: Diagnosis not present

## 2016-06-01 DIAGNOSIS — Z992 Dependence on renal dialysis: Secondary | ICD-10-CM | POA: Diagnosis not present

## 2016-06-01 DIAGNOSIS — N186 End stage renal disease: Secondary | ICD-10-CM | POA: Diagnosis not present

## 2016-06-01 DIAGNOSIS — L89154 Pressure ulcer of sacral region, stage 4: Secondary | ICD-10-CM | POA: Diagnosis not present

## 2016-06-01 DIAGNOSIS — E1122 Type 2 diabetes mellitus with diabetic chronic kidney disease: Secondary | ICD-10-CM | POA: Diagnosis not present

## 2016-06-01 DIAGNOSIS — I252 Old myocardial infarction: Secondary | ICD-10-CM | POA: Diagnosis not present

## 2016-06-01 DIAGNOSIS — I120 Hypertensive chronic kidney disease with stage 5 chronic kidney disease or end stage renal disease: Secondary | ICD-10-CM | POA: Diagnosis not present

## 2016-06-01 DIAGNOSIS — L89153 Pressure ulcer of sacral region, stage 3: Secondary | ICD-10-CM | POA: Diagnosis not present

## 2016-06-01 DIAGNOSIS — I2510 Atherosclerotic heart disease of native coronary artery without angina pectoris: Secondary | ICD-10-CM | POA: Diagnosis not present

## 2016-06-01 DIAGNOSIS — Z8673 Personal history of transient ischemic attack (TIA), and cerebral infarction without residual deficits: Secondary | ICD-10-CM | POA: Diagnosis not present

## 2016-06-02 DIAGNOSIS — N179 Acute kidney failure, unspecified: Secondary | ICD-10-CM | POA: Diagnosis not present

## 2016-06-02 DIAGNOSIS — E1129 Type 2 diabetes mellitus with other diabetic kidney complication: Secondary | ICD-10-CM | POA: Diagnosis not present

## 2016-06-02 DIAGNOSIS — N2581 Secondary hyperparathyroidism of renal origin: Secondary | ICD-10-CM | POA: Diagnosis not present

## 2016-06-02 DIAGNOSIS — E785 Hyperlipidemia, unspecified: Secondary | ICD-10-CM | POA: Diagnosis not present

## 2016-06-02 DIAGNOSIS — R279 Unspecified lack of coordination: Secondary | ICD-10-CM | POA: Diagnosis not present

## 2016-06-02 DIAGNOSIS — N186 End stage renal disease: Secondary | ICD-10-CM | POA: Diagnosis not present

## 2016-06-02 DIAGNOSIS — Z89511 Acquired absence of right leg below knee: Secondary | ICD-10-CM | POA: Diagnosis not present

## 2016-06-02 DIAGNOSIS — I2510 Atherosclerotic heart disease of native coronary artery without angina pectoris: Secondary | ICD-10-CM | POA: Diagnosis not present

## 2016-06-02 DIAGNOSIS — D631 Anemia in chronic kidney disease: Secondary | ICD-10-CM | POA: Diagnosis not present

## 2016-06-02 DIAGNOSIS — T879 Unspecified complications of amputation stump: Secondary | ICD-10-CM | POA: Diagnosis not present

## 2016-06-02 DIAGNOSIS — M6281 Muscle weakness (generalized): Secondary | ICD-10-CM | POA: Diagnosis not present

## 2016-06-02 DIAGNOSIS — R531 Weakness: Secondary | ICD-10-CM | POA: Diagnosis not present

## 2016-06-02 DIAGNOSIS — R262 Difficulty in walking, not elsewhere classified: Secondary | ICD-10-CM | POA: Diagnosis not present

## 2016-06-02 DIAGNOSIS — I739 Peripheral vascular disease, unspecified: Secondary | ICD-10-CM | POA: Diagnosis not present

## 2016-06-02 DIAGNOSIS — Z418 Encounter for other procedures for purposes other than remedying health state: Secondary | ICD-10-CM | POA: Diagnosis not present

## 2016-06-02 DIAGNOSIS — Z992 Dependence on renal dialysis: Secondary | ICD-10-CM | POA: Diagnosis not present

## 2016-06-02 DIAGNOSIS — L8990 Pressure ulcer of unspecified site, unspecified stage: Secondary | ICD-10-CM | POA: Diagnosis not present

## 2016-06-02 DIAGNOSIS — D509 Iron deficiency anemia, unspecified: Secondary | ICD-10-CM | POA: Diagnosis not present

## 2016-06-03 DIAGNOSIS — M6281 Muscle weakness (generalized): Secondary | ICD-10-CM | POA: Diagnosis not present

## 2016-06-03 DIAGNOSIS — Z89511 Acquired absence of right leg below knee: Secondary | ICD-10-CM | POA: Diagnosis not present

## 2016-06-03 DIAGNOSIS — Z992 Dependence on renal dialysis: Secondary | ICD-10-CM | POA: Diagnosis not present

## 2016-06-03 DIAGNOSIS — I2510 Atherosclerotic heart disease of native coronary artery without angina pectoris: Secondary | ICD-10-CM | POA: Diagnosis not present

## 2016-06-03 DIAGNOSIS — R262 Difficulty in walking, not elsewhere classified: Secondary | ICD-10-CM | POA: Diagnosis not present

## 2016-06-03 DIAGNOSIS — R531 Weakness: Secondary | ICD-10-CM | POA: Diagnosis not present

## 2016-06-03 DIAGNOSIS — R279 Unspecified lack of coordination: Secondary | ICD-10-CM | POA: Diagnosis not present

## 2016-06-03 DIAGNOSIS — T879 Unspecified complications of amputation stump: Secondary | ICD-10-CM | POA: Diagnosis not present

## 2016-06-03 DIAGNOSIS — N179 Acute kidney failure, unspecified: Secondary | ICD-10-CM | POA: Diagnosis not present

## 2016-06-04 DIAGNOSIS — N186 End stage renal disease: Secondary | ICD-10-CM | POA: Diagnosis not present

## 2016-06-04 DIAGNOSIS — N2581 Secondary hyperparathyroidism of renal origin: Secondary | ICD-10-CM | POA: Diagnosis not present

## 2016-06-04 DIAGNOSIS — Z418 Encounter for other procedures for purposes other than remedying health state: Secondary | ICD-10-CM | POA: Diagnosis not present

## 2016-06-04 DIAGNOSIS — E119 Type 2 diabetes mellitus without complications: Secondary | ICD-10-CM | POA: Diagnosis not present

## 2016-06-04 DIAGNOSIS — D509 Iron deficiency anemia, unspecified: Secondary | ICD-10-CM | POA: Diagnosis not present

## 2016-06-04 DIAGNOSIS — D631 Anemia in chronic kidney disease: Secondary | ICD-10-CM | POA: Diagnosis not present

## 2016-06-05 DIAGNOSIS — T879 Unspecified complications of amputation stump: Secondary | ICD-10-CM | POA: Diagnosis not present

## 2016-06-05 DIAGNOSIS — Z89511 Acquired absence of right leg below knee: Secondary | ICD-10-CM | POA: Diagnosis not present

## 2016-06-05 DIAGNOSIS — M6281 Muscle weakness (generalized): Secondary | ICD-10-CM | POA: Diagnosis not present

## 2016-06-05 DIAGNOSIS — Z992 Dependence on renal dialysis: Secondary | ICD-10-CM | POA: Diagnosis not present

## 2016-06-05 DIAGNOSIS — R262 Difficulty in walking, not elsewhere classified: Secondary | ICD-10-CM | POA: Diagnosis not present

## 2016-06-05 DIAGNOSIS — N179 Acute kidney failure, unspecified: Secondary | ICD-10-CM | POA: Diagnosis not present

## 2016-06-05 DIAGNOSIS — I2510 Atherosclerotic heart disease of native coronary artery without angina pectoris: Secondary | ICD-10-CM | POA: Diagnosis not present

## 2016-06-05 DIAGNOSIS — R531 Weakness: Secondary | ICD-10-CM | POA: Diagnosis not present

## 2016-06-05 DIAGNOSIS — R279 Unspecified lack of coordination: Secondary | ICD-10-CM | POA: Diagnosis not present

## 2016-06-06 DIAGNOSIS — N186 End stage renal disease: Secondary | ICD-10-CM | POA: Diagnosis not present

## 2016-06-06 DIAGNOSIS — N2581 Secondary hyperparathyroidism of renal origin: Secondary | ICD-10-CM | POA: Diagnosis not present

## 2016-06-06 DIAGNOSIS — D631 Anemia in chronic kidney disease: Secondary | ICD-10-CM | POA: Diagnosis not present

## 2016-06-06 DIAGNOSIS — Z418 Encounter for other procedures for purposes other than remedying health state: Secondary | ICD-10-CM | POA: Diagnosis not present

## 2016-06-06 DIAGNOSIS — D509 Iron deficiency anemia, unspecified: Secondary | ICD-10-CM | POA: Diagnosis not present

## 2016-06-08 DIAGNOSIS — R531 Weakness: Secondary | ICD-10-CM | POA: Diagnosis not present

## 2016-06-08 DIAGNOSIS — Z89511 Acquired absence of right leg below knee: Secondary | ICD-10-CM | POA: Diagnosis not present

## 2016-06-08 DIAGNOSIS — R262 Difficulty in walking, not elsewhere classified: Secondary | ICD-10-CM | POA: Diagnosis not present

## 2016-06-08 DIAGNOSIS — T879 Unspecified complications of amputation stump: Secondary | ICD-10-CM | POA: Diagnosis not present

## 2016-06-08 DIAGNOSIS — Z992 Dependence on renal dialysis: Secondary | ICD-10-CM | POA: Diagnosis not present

## 2016-06-08 DIAGNOSIS — M6281 Muscle weakness (generalized): Secondary | ICD-10-CM | POA: Diagnosis not present

## 2016-06-08 DIAGNOSIS — N179 Acute kidney failure, unspecified: Secondary | ICD-10-CM | POA: Diagnosis not present

## 2016-06-08 DIAGNOSIS — I2510 Atherosclerotic heart disease of native coronary artery without angina pectoris: Secondary | ICD-10-CM | POA: Diagnosis not present

## 2016-06-08 DIAGNOSIS — R279 Unspecified lack of coordination: Secondary | ICD-10-CM | POA: Diagnosis not present

## 2016-06-09 DIAGNOSIS — N186 End stage renal disease: Secondary | ICD-10-CM | POA: Diagnosis not present

## 2016-06-09 DIAGNOSIS — E119 Type 2 diabetes mellitus without complications: Secondary | ICD-10-CM | POA: Diagnosis not present

## 2016-06-10 DIAGNOSIS — Z7982 Long term (current) use of aspirin: Secondary | ICD-10-CM | POA: Diagnosis not present

## 2016-06-10 DIAGNOSIS — Z992 Dependence on renal dialysis: Secondary | ICD-10-CM | POA: Diagnosis not present

## 2016-06-10 DIAGNOSIS — E1122 Type 2 diabetes mellitus with diabetic chronic kidney disease: Secondary | ICD-10-CM | POA: Diagnosis not present

## 2016-06-10 DIAGNOSIS — N179 Acute kidney failure, unspecified: Secondary | ICD-10-CM | POA: Diagnosis not present

## 2016-06-10 DIAGNOSIS — D631 Anemia in chronic kidney disease: Secondary | ICD-10-CM | POA: Diagnosis not present

## 2016-06-10 DIAGNOSIS — E1140 Type 2 diabetes mellitus with diabetic neuropathy, unspecified: Secondary | ICD-10-CM | POA: Diagnosis not present

## 2016-06-10 DIAGNOSIS — R262 Difficulty in walking, not elsewhere classified: Secondary | ICD-10-CM | POA: Diagnosis not present

## 2016-06-10 DIAGNOSIS — Z79891 Long term (current) use of opiate analgesic: Secondary | ICD-10-CM | POA: Diagnosis not present

## 2016-06-10 DIAGNOSIS — M6281 Muscle weakness (generalized): Secondary | ICD-10-CM | POA: Diagnosis not present

## 2016-06-10 DIAGNOSIS — T8249XA Other complication of vascular dialysis catheter, initial encounter: Secondary | ICD-10-CM | POA: Diagnosis not present

## 2016-06-10 DIAGNOSIS — N186 End stage renal disease: Secondary | ICD-10-CM | POA: Diagnosis not present

## 2016-06-10 DIAGNOSIS — R279 Unspecified lack of coordination: Secondary | ICD-10-CM | POA: Diagnosis not present

## 2016-06-10 DIAGNOSIS — R531 Weakness: Secondary | ICD-10-CM | POA: Diagnosis not present

## 2016-06-10 DIAGNOSIS — I120 Hypertensive chronic kidney disease with stage 5 chronic kidney disease or end stage renal disease: Secondary | ICD-10-CM | POA: Diagnosis not present

## 2016-06-10 DIAGNOSIS — T879 Unspecified complications of amputation stump: Secondary | ICD-10-CM | POA: Diagnosis not present

## 2016-06-10 DIAGNOSIS — I252 Old myocardial infarction: Secondary | ICD-10-CM | POA: Diagnosis not present

## 2016-06-10 DIAGNOSIS — N2581 Secondary hyperparathyroidism of renal origin: Secondary | ICD-10-CM | POA: Diagnosis not present

## 2016-06-10 DIAGNOSIS — T82828A Fibrosis of vascular prosthetic devices, implants and grafts, initial encounter: Secondary | ICD-10-CM | POA: Diagnosis not present

## 2016-06-10 DIAGNOSIS — Z79899 Other long term (current) drug therapy: Secondary | ICD-10-CM | POA: Diagnosis not present

## 2016-06-10 DIAGNOSIS — I2510 Atherosclerotic heart disease of native coronary artery without angina pectoris: Secondary | ICD-10-CM | POA: Diagnosis not present

## 2016-06-10 DIAGNOSIS — Z89511 Acquired absence of right leg below knee: Secondary | ICD-10-CM | POA: Diagnosis not present

## 2016-06-10 DIAGNOSIS — Z794 Long term (current) use of insulin: Secondary | ICD-10-CM | POA: Diagnosis not present

## 2016-06-11 DIAGNOSIS — D631 Anemia in chronic kidney disease: Secondary | ICD-10-CM | POA: Diagnosis not present

## 2016-06-11 DIAGNOSIS — N2581 Secondary hyperparathyroidism of renal origin: Secondary | ICD-10-CM | POA: Diagnosis not present

## 2016-06-11 DIAGNOSIS — Z418 Encounter for other procedures for purposes other than remedying health state: Secondary | ICD-10-CM | POA: Diagnosis not present

## 2016-06-11 DIAGNOSIS — D509 Iron deficiency anemia, unspecified: Secondary | ICD-10-CM | POA: Diagnosis not present

## 2016-06-11 DIAGNOSIS — N186 End stage renal disease: Secondary | ICD-10-CM | POA: Diagnosis not present

## 2016-06-12 DIAGNOSIS — R279 Unspecified lack of coordination: Secondary | ICD-10-CM | POA: Diagnosis not present

## 2016-06-12 DIAGNOSIS — Z992 Dependence on renal dialysis: Secondary | ICD-10-CM | POA: Diagnosis not present

## 2016-06-12 DIAGNOSIS — Z89511 Acquired absence of right leg below knee: Secondary | ICD-10-CM | POA: Diagnosis not present

## 2016-06-12 DIAGNOSIS — M6281 Muscle weakness (generalized): Secondary | ICD-10-CM | POA: Diagnosis not present

## 2016-06-12 DIAGNOSIS — T879 Unspecified complications of amputation stump: Secondary | ICD-10-CM | POA: Diagnosis not present

## 2016-06-12 DIAGNOSIS — N179 Acute kidney failure, unspecified: Secondary | ICD-10-CM | POA: Diagnosis not present

## 2016-06-12 DIAGNOSIS — R531 Weakness: Secondary | ICD-10-CM | POA: Diagnosis not present

## 2016-06-12 DIAGNOSIS — R262 Difficulty in walking, not elsewhere classified: Secondary | ICD-10-CM | POA: Diagnosis not present

## 2016-06-12 DIAGNOSIS — I2510 Atherosclerotic heart disease of native coronary artery without angina pectoris: Secondary | ICD-10-CM | POA: Diagnosis not present

## 2016-06-13 DIAGNOSIS — D509 Iron deficiency anemia, unspecified: Secondary | ICD-10-CM | POA: Diagnosis not present

## 2016-06-13 DIAGNOSIS — D631 Anemia in chronic kidney disease: Secondary | ICD-10-CM | POA: Diagnosis not present

## 2016-06-13 DIAGNOSIS — Z418 Encounter for other procedures for purposes other than remedying health state: Secondary | ICD-10-CM | POA: Diagnosis not present

## 2016-06-13 DIAGNOSIS — N186 End stage renal disease: Secondary | ICD-10-CM | POA: Diagnosis not present

## 2016-06-15 DIAGNOSIS — Z89511 Acquired absence of right leg below knee: Secondary | ICD-10-CM | POA: Diagnosis not present

## 2016-06-15 DIAGNOSIS — M6281 Muscle weakness (generalized): Secondary | ICD-10-CM | POA: Diagnosis not present

## 2016-06-15 DIAGNOSIS — R279 Unspecified lack of coordination: Secondary | ICD-10-CM | POA: Diagnosis not present

## 2016-06-15 DIAGNOSIS — R262 Difficulty in walking, not elsewhere classified: Secondary | ICD-10-CM | POA: Diagnosis not present

## 2016-06-15 DIAGNOSIS — R531 Weakness: Secondary | ICD-10-CM | POA: Diagnosis not present

## 2016-06-15 DIAGNOSIS — T879 Unspecified complications of amputation stump: Secondary | ICD-10-CM | POA: Diagnosis not present

## 2016-06-15 DIAGNOSIS — I2510 Atherosclerotic heart disease of native coronary artery without angina pectoris: Secondary | ICD-10-CM | POA: Diagnosis not present

## 2016-06-15 DIAGNOSIS — Z992 Dependence on renal dialysis: Secondary | ICD-10-CM | POA: Diagnosis not present

## 2016-06-15 DIAGNOSIS — N179 Acute kidney failure, unspecified: Secondary | ICD-10-CM | POA: Diagnosis not present

## 2016-06-16 DIAGNOSIS — D509 Iron deficiency anemia, unspecified: Secondary | ICD-10-CM | POA: Diagnosis not present

## 2016-06-16 DIAGNOSIS — N186 End stage renal disease: Secondary | ICD-10-CM | POA: Diagnosis not present

## 2016-06-16 DIAGNOSIS — Z418 Encounter for other procedures for purposes other than remedying health state: Secondary | ICD-10-CM | POA: Diagnosis not present

## 2016-06-16 DIAGNOSIS — D631 Anemia in chronic kidney disease: Secondary | ICD-10-CM | POA: Diagnosis not present

## 2016-06-17 DIAGNOSIS — Z992 Dependence on renal dialysis: Secondary | ICD-10-CM | POA: Diagnosis not present

## 2016-06-17 DIAGNOSIS — I2510 Atherosclerotic heart disease of native coronary artery without angina pectoris: Secondary | ICD-10-CM | POA: Diagnosis not present

## 2016-06-17 DIAGNOSIS — M6281 Muscle weakness (generalized): Secondary | ICD-10-CM | POA: Diagnosis not present

## 2016-06-17 DIAGNOSIS — Z89511 Acquired absence of right leg below knee: Secondary | ICD-10-CM | POA: Diagnosis not present

## 2016-06-17 DIAGNOSIS — R279 Unspecified lack of coordination: Secondary | ICD-10-CM | POA: Diagnosis not present

## 2016-06-17 DIAGNOSIS — N179 Acute kidney failure, unspecified: Secondary | ICD-10-CM | POA: Diagnosis not present

## 2016-06-17 DIAGNOSIS — T879 Unspecified complications of amputation stump: Secondary | ICD-10-CM | POA: Diagnosis not present

## 2016-06-17 DIAGNOSIS — R531 Weakness: Secondary | ICD-10-CM | POA: Diagnosis not present

## 2016-06-17 DIAGNOSIS — R262 Difficulty in walking, not elsewhere classified: Secondary | ICD-10-CM | POA: Diagnosis not present

## 2016-06-18 DIAGNOSIS — D631 Anemia in chronic kidney disease: Secondary | ICD-10-CM | POA: Diagnosis not present

## 2016-06-18 DIAGNOSIS — Z418 Encounter for other procedures for purposes other than remedying health state: Secondary | ICD-10-CM | POA: Diagnosis not present

## 2016-06-18 DIAGNOSIS — N186 End stage renal disease: Secondary | ICD-10-CM | POA: Diagnosis not present

## 2016-06-18 DIAGNOSIS — D509 Iron deficiency anemia, unspecified: Secondary | ICD-10-CM | POA: Diagnosis not present

## 2016-06-19 DIAGNOSIS — Z89511 Acquired absence of right leg below knee: Secondary | ICD-10-CM | POA: Diagnosis not present

## 2016-06-19 DIAGNOSIS — R279 Unspecified lack of coordination: Secondary | ICD-10-CM | POA: Diagnosis not present

## 2016-06-19 DIAGNOSIS — M6281 Muscle weakness (generalized): Secondary | ICD-10-CM | POA: Diagnosis not present

## 2016-06-19 DIAGNOSIS — N179 Acute kidney failure, unspecified: Secondary | ICD-10-CM | POA: Diagnosis not present

## 2016-06-19 DIAGNOSIS — R531 Weakness: Secondary | ICD-10-CM | POA: Diagnosis not present

## 2016-06-19 DIAGNOSIS — T879 Unspecified complications of amputation stump: Secondary | ICD-10-CM | POA: Diagnosis not present

## 2016-06-19 DIAGNOSIS — Z992 Dependence on renal dialysis: Secondary | ICD-10-CM | POA: Diagnosis not present

## 2016-06-19 DIAGNOSIS — R262 Difficulty in walking, not elsewhere classified: Secondary | ICD-10-CM | POA: Diagnosis not present

## 2016-06-19 DIAGNOSIS — I2510 Atherosclerotic heart disease of native coronary artery without angina pectoris: Secondary | ICD-10-CM | POA: Diagnosis not present

## 2016-06-20 DIAGNOSIS — D631 Anemia in chronic kidney disease: Secondary | ICD-10-CM | POA: Diagnosis not present

## 2016-06-20 DIAGNOSIS — N186 End stage renal disease: Secondary | ICD-10-CM | POA: Diagnosis not present

## 2016-06-20 DIAGNOSIS — D509 Iron deficiency anemia, unspecified: Secondary | ICD-10-CM | POA: Diagnosis not present

## 2016-06-20 DIAGNOSIS — Z418 Encounter for other procedures for purposes other than remedying health state: Secondary | ICD-10-CM | POA: Diagnosis not present

## 2016-06-21 DIAGNOSIS — L89153 Pressure ulcer of sacral region, stage 3: Secondary | ICD-10-CM | POA: Diagnosis not present

## 2016-06-21 DIAGNOSIS — L97423 Non-pressure chronic ulcer of left heel and midfoot with necrosis of muscle: Secondary | ICD-10-CM | POA: Diagnosis not present

## 2016-06-22 DIAGNOSIS — T879 Unspecified complications of amputation stump: Secondary | ICD-10-CM | POA: Diagnosis not present

## 2016-06-22 DIAGNOSIS — E119 Type 2 diabetes mellitus without complications: Secondary | ICD-10-CM | POA: Diagnosis not present

## 2016-06-22 DIAGNOSIS — M6281 Muscle weakness (generalized): Secondary | ICD-10-CM | POA: Diagnosis not present

## 2016-06-22 DIAGNOSIS — N186 End stage renal disease: Secondary | ICD-10-CM | POA: Diagnosis not present

## 2016-06-22 DIAGNOSIS — N179 Acute kidney failure, unspecified: Secondary | ICD-10-CM | POA: Diagnosis not present

## 2016-06-22 DIAGNOSIS — Z89511 Acquired absence of right leg below knee: Secondary | ICD-10-CM | POA: Diagnosis not present

## 2016-06-22 DIAGNOSIS — R531 Weakness: Secondary | ICD-10-CM | POA: Diagnosis not present

## 2016-06-22 DIAGNOSIS — R262 Difficulty in walking, not elsewhere classified: Secondary | ICD-10-CM | POA: Diagnosis not present

## 2016-06-22 DIAGNOSIS — I2510 Atherosclerotic heart disease of native coronary artery without angina pectoris: Secondary | ICD-10-CM | POA: Diagnosis not present

## 2016-06-22 DIAGNOSIS — R279 Unspecified lack of coordination: Secondary | ICD-10-CM | POA: Diagnosis not present

## 2016-06-22 DIAGNOSIS — Z992 Dependence on renal dialysis: Secondary | ICD-10-CM | POA: Diagnosis not present

## 2016-06-23 DIAGNOSIS — Z418 Encounter for other procedures for purposes other than remedying health state: Secondary | ICD-10-CM | POA: Diagnosis not present

## 2016-06-23 DIAGNOSIS — N186 End stage renal disease: Secondary | ICD-10-CM | POA: Diagnosis not present

## 2016-06-23 DIAGNOSIS — D509 Iron deficiency anemia, unspecified: Secondary | ICD-10-CM | POA: Diagnosis not present

## 2016-06-23 DIAGNOSIS — D631 Anemia in chronic kidney disease: Secondary | ICD-10-CM | POA: Diagnosis not present

## 2016-06-24 DIAGNOSIS — I2510 Atherosclerotic heart disease of native coronary artery without angina pectoris: Secondary | ICD-10-CM | POA: Diagnosis not present

## 2016-06-24 DIAGNOSIS — R279 Unspecified lack of coordination: Secondary | ICD-10-CM | POA: Diagnosis not present

## 2016-06-24 DIAGNOSIS — Z992 Dependence on renal dialysis: Secondary | ICD-10-CM | POA: Diagnosis not present

## 2016-06-24 DIAGNOSIS — R262 Difficulty in walking, not elsewhere classified: Secondary | ICD-10-CM | POA: Diagnosis not present

## 2016-06-24 DIAGNOSIS — M6281 Muscle weakness (generalized): Secondary | ICD-10-CM | POA: Diagnosis not present

## 2016-06-24 DIAGNOSIS — N179 Acute kidney failure, unspecified: Secondary | ICD-10-CM | POA: Diagnosis not present

## 2016-06-24 DIAGNOSIS — Z89511 Acquired absence of right leg below knee: Secondary | ICD-10-CM | POA: Diagnosis not present

## 2016-06-24 DIAGNOSIS — T879 Unspecified complications of amputation stump: Secondary | ICD-10-CM | POA: Diagnosis not present

## 2016-06-24 DIAGNOSIS — R531 Weakness: Secondary | ICD-10-CM | POA: Diagnosis not present

## 2016-06-25 DIAGNOSIS — D509 Iron deficiency anemia, unspecified: Secondary | ICD-10-CM | POA: Diagnosis not present

## 2016-06-25 DIAGNOSIS — D631 Anemia in chronic kidney disease: Secondary | ICD-10-CM | POA: Diagnosis not present

## 2016-06-25 DIAGNOSIS — Z418 Encounter for other procedures for purposes other than remedying health state: Secondary | ICD-10-CM | POA: Diagnosis not present

## 2016-06-25 DIAGNOSIS — N186 End stage renal disease: Secondary | ICD-10-CM | POA: Diagnosis not present

## 2016-06-26 DIAGNOSIS — Z992 Dependence on renal dialysis: Secondary | ICD-10-CM | POA: Diagnosis not present

## 2016-06-26 DIAGNOSIS — Z89511 Acquired absence of right leg below knee: Secondary | ICD-10-CM | POA: Diagnosis not present

## 2016-06-26 DIAGNOSIS — R279 Unspecified lack of coordination: Secondary | ICD-10-CM | POA: Diagnosis not present

## 2016-06-26 DIAGNOSIS — R262 Difficulty in walking, not elsewhere classified: Secondary | ICD-10-CM | POA: Diagnosis not present

## 2016-06-26 DIAGNOSIS — N179 Acute kidney failure, unspecified: Secondary | ICD-10-CM | POA: Diagnosis not present

## 2016-06-26 DIAGNOSIS — I2510 Atherosclerotic heart disease of native coronary artery without angina pectoris: Secondary | ICD-10-CM | POA: Diagnosis not present

## 2016-06-26 DIAGNOSIS — T879 Unspecified complications of amputation stump: Secondary | ICD-10-CM | POA: Diagnosis not present

## 2016-06-26 DIAGNOSIS — R531 Weakness: Secondary | ICD-10-CM | POA: Diagnosis not present

## 2016-06-26 DIAGNOSIS — M6281 Muscle weakness (generalized): Secondary | ICD-10-CM | POA: Diagnosis not present

## 2016-06-27 DIAGNOSIS — Z418 Encounter for other procedures for purposes other than remedying health state: Secondary | ICD-10-CM | POA: Diagnosis not present

## 2016-06-27 DIAGNOSIS — D631 Anemia in chronic kidney disease: Secondary | ICD-10-CM | POA: Diagnosis not present

## 2016-06-27 DIAGNOSIS — N186 End stage renal disease: Secondary | ICD-10-CM | POA: Diagnosis not present

## 2016-06-27 DIAGNOSIS — D509 Iron deficiency anemia, unspecified: Secondary | ICD-10-CM | POA: Diagnosis not present

## 2016-06-28 DIAGNOSIS — L97423 Non-pressure chronic ulcer of left heel and midfoot with necrosis of muscle: Secondary | ICD-10-CM | POA: Diagnosis not present

## 2016-06-28 DIAGNOSIS — L89153 Pressure ulcer of sacral region, stage 3: Secondary | ICD-10-CM | POA: Diagnosis not present

## 2016-06-29 DIAGNOSIS — Z992 Dependence on renal dialysis: Secondary | ICD-10-CM | POA: Diagnosis not present

## 2016-06-29 DIAGNOSIS — T879 Unspecified complications of amputation stump: Secondary | ICD-10-CM | POA: Diagnosis not present

## 2016-06-29 DIAGNOSIS — M6281 Muscle weakness (generalized): Secondary | ICD-10-CM | POA: Diagnosis not present

## 2016-06-29 DIAGNOSIS — R262 Difficulty in walking, not elsewhere classified: Secondary | ICD-10-CM | POA: Diagnosis not present

## 2016-06-29 DIAGNOSIS — R279 Unspecified lack of coordination: Secondary | ICD-10-CM | POA: Diagnosis not present

## 2016-06-29 DIAGNOSIS — R531 Weakness: Secondary | ICD-10-CM | POA: Diagnosis not present

## 2016-06-29 DIAGNOSIS — Z89511 Acquired absence of right leg below knee: Secondary | ICD-10-CM | POA: Diagnosis not present

## 2016-06-29 DIAGNOSIS — N179 Acute kidney failure, unspecified: Secondary | ICD-10-CM | POA: Diagnosis not present

## 2016-06-29 DIAGNOSIS — I2510 Atherosclerotic heart disease of native coronary artery without angina pectoris: Secondary | ICD-10-CM | POA: Diagnosis not present

## 2016-06-30 DIAGNOSIS — D631 Anemia in chronic kidney disease: Secondary | ICD-10-CM | POA: Diagnosis not present

## 2016-06-30 DIAGNOSIS — D509 Iron deficiency anemia, unspecified: Secondary | ICD-10-CM | POA: Diagnosis not present

## 2016-06-30 DIAGNOSIS — N186 End stage renal disease: Secondary | ICD-10-CM | POA: Diagnosis not present

## 2016-06-30 DIAGNOSIS — Z418 Encounter for other procedures for purposes other than remedying health state: Secondary | ICD-10-CM | POA: Diagnosis not present

## 2016-07-01 DIAGNOSIS — R279 Unspecified lack of coordination: Secondary | ICD-10-CM | POA: Diagnosis not present

## 2016-07-01 DIAGNOSIS — Z992 Dependence on renal dialysis: Secondary | ICD-10-CM | POA: Diagnosis not present

## 2016-07-01 DIAGNOSIS — R262 Difficulty in walking, not elsewhere classified: Secondary | ICD-10-CM | POA: Diagnosis not present

## 2016-07-01 DIAGNOSIS — M6281 Muscle weakness (generalized): Secondary | ICD-10-CM | POA: Diagnosis not present

## 2016-07-01 DIAGNOSIS — R531 Weakness: Secondary | ICD-10-CM | POA: Diagnosis not present

## 2016-07-01 DIAGNOSIS — I2510 Atherosclerotic heart disease of native coronary artery without angina pectoris: Secondary | ICD-10-CM | POA: Diagnosis not present

## 2016-07-01 DIAGNOSIS — T879 Unspecified complications of amputation stump: Secondary | ICD-10-CM | POA: Diagnosis not present

## 2016-07-01 DIAGNOSIS — N179 Acute kidney failure, unspecified: Secondary | ICD-10-CM | POA: Diagnosis not present

## 2016-07-01 DIAGNOSIS — Z89511 Acquired absence of right leg below knee: Secondary | ICD-10-CM | POA: Diagnosis not present

## 2016-07-02 DIAGNOSIS — N186 End stage renal disease: Secondary | ICD-10-CM | POA: Diagnosis not present

## 2016-07-02 DIAGNOSIS — Z992 Dependence on renal dialysis: Secondary | ICD-10-CM | POA: Diagnosis not present

## 2016-07-02 DIAGNOSIS — D509 Iron deficiency anemia, unspecified: Secondary | ICD-10-CM | POA: Diagnosis not present

## 2016-07-02 DIAGNOSIS — Z418 Encounter for other procedures for purposes other than remedying health state: Secondary | ICD-10-CM | POA: Diagnosis not present

## 2016-07-02 DIAGNOSIS — D631 Anemia in chronic kidney disease: Secondary | ICD-10-CM | POA: Diagnosis not present

## 2016-08-02 DEATH — deceased
